# Patient Record
Sex: Male | Born: 1990 | Hispanic: No | Marital: Single
Health system: Midwestern US, Community
[De-identification: ages and names within clinical notes are randomized; demographics above are authoritative.]

## PROBLEM LIST (undated history)

## (undated) DIAGNOSIS — K8020 Calculus of gallbladder without cholecystitis without obstruction: Secondary | ICD-10-CM

---
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
## 2014-11-20 ENCOUNTER — Emergency Department (HOSPITAL_COMMUNITY)
Admission: EM | Admit: 2014-11-20 | Discharge: 2014-11-20 | Disposition: A | Discharge status: Home / Self Care | Payer: Self-pay | Attending: Emergency Medicine | Admitting: Emergency Medicine

## 2014-11-20 ENCOUNTER — Encounter (HOSPITAL_COMMUNITY): Payer: Self-pay | Admitting: *Deleted

## 2014-11-20 DIAGNOSIS — I159 Secondary hypertension, unspecified: Secondary | ICD-10-CM | POA: Insufficient documentation

## 2014-11-20 DIAGNOSIS — B9789 Other viral agents as the cause of diseases classified elsewhere: Secondary | ICD-10-CM

## 2014-11-20 DIAGNOSIS — J069 Acute upper respiratory infection, unspecified: Secondary | ICD-10-CM | POA: Insufficient documentation

## 2014-11-20 MED ORDER — IBUPROFEN 800 MG PO TABS
800.0000 mg | ORAL_TABLET | Freq: Once | ORAL | Status: AC
  Administered 2014-11-20: 800 mg via ORAL
  Filled 2014-11-20: qty 1

## 2014-11-20 MED ORDER — DM-GUAIFENESIN ER 30-600 MG PO TB12
1.0000 | ORAL_TABLET | ORAL | Status: AC
  Administered 2014-11-20: 1 via ORAL
  Filled 2014-11-20: qty 1

## 2014-11-20 MED ORDER — DICYCLOMINE HCL 10 MG PO CAPS
10.0000 mg | ORAL_CAPSULE | Freq: Once | ORAL | Status: AC
  Administered 2014-11-20: 10 mg via ORAL
  Filled 2014-11-20: qty 1

## 2014-11-20 NOTE — Discharge Instructions (Signed)
Upper Respiratory Infection, Adult Shane Wells, he was seen today for a viral respiratory infection. You can take Motrin and Mucinex at home for symptomatic relief. There is no cure, this will have to run its course. Follow-up with a primary care physician regarding this infection and your high blood pressure. It is dangerous for it to stay high as it was today, 193/75.  If symptoms worsen come back to emergency department immediately.  Thank you. An upper respiratory infection (URI) is also known as the common cold. It is often caused by a type of germ (virus). Colds are easily spread (contagious). You can pass it to others by kissing, coughing, sneezing, or drinking out of the same glass. Usually, you get better in 1 or 2 weeks.  HOME CARE   Only take medicine as told by your doctor.  Use a warm mist humidifier or breathe in steam from a hot shower.  Drink enough water and fluids to keep your pee (urine) clear or pale yellow.  Get plenty of rest.  Return to work when your temperature is back to normal or as told by your doctor. You may use a face mask and wash your hands to stop your cold from spreading. GET HELP RIGHT AWAY IF:   After the first few days, you feel you are getting worse.  You have questions about your medicine.  You have chills, shortness of breath, or brown or red spit (mucus).  You have yellow or brown snot (nasal discharge) or pain in the face, especially when you bend forward.  You have a fever, puffy (swollen) neck, pain when you swallow, or white spots in the back of your throat.  You have a bad headache, ear pain, sinus pain, or chest pain.  You have a high-pitched whistling sound when you breathe in and out (wheezing).  You have a lasting cough or cough up blood.  You have sore muscles or a stiff neck. MAKE SURE YOU:   Understand these instructions.  Will watch your condition.  Will get help right away if you are not doing well or get worse. Document  Released: 04/19/2008 Document Revised: 01/24/2012 Document Reviewed: 02/06/2014 [facility] Patient Information 2015 [place]. This information is not intended to replace advice given to you by your health care provider. Make sure you discuss any questions you have with your health care provider.  Hypertension Hypertension is another name for high blood pressure. High blood pressure forces your heart to work harder to pump blood. A blood pressure reading has two numbers, which includes a higher number over a lower number (example: 110/72). HOME CARE   Have your blood pressure rechecked by your doctor.  Only take medicine as told by your doctor. Follow the directions carefully. The medicine does not work as well if you skip doses. Skipping doses also puts you at risk for problems.  Do not smoke.  Monitor your blood pressure at home as told by your doctor. GET HELP IF:  You think you are having a reaction to the medicine you are taking.  You have repeat headaches or feel dizzy.  You have puffiness (swelling) in your ankles.  You have trouble with your vision. GET HELP RIGHT AWAY IF:   You get a very bad headache and are confused.  You feel weak, numb, or faint.  You get chest or belly (abdominal) pain.  You throw up (vomit).  You cannot breathe very well. MAKE SURE YOU:   Understand these instructions.  Will  watch your condition.  Will get help right away if you are not doing well or get worse. Document Released: 04/19/2008 Document Revised: 11/06/2013 Document Reviewed: 08/24/2013 [facility] Patient Information 2015 [place], [place]. This information is not intended to replace advice given to you by your health care provider. Make sure you discuss any questions you have with your health care provider.

## 2014-11-20 NOTE — ED Provider Notes (Signed)
CSN: [id number]     Arrival date & time 11/20/14  0435 History   First MD Initiated Contact with Patient 11/20/14 [phone]     Chief Complaint  Patient presents with  . Nasal Congestion  . Sore Throat     (Consider location/radiation/quality/duration/timing/severity/associated sxs/prior Treatment) HPI  Shane Wells is a 24 y.o. male with no significant past medical history coming in with 2-3 days of viral URI like symptoms. Patient states he has had nasal congestion and sore throat. He denies fevers. He has had no sick contacts. Patient states he has had coughing and diffuse body aches as well. He denies getting the flu shot this year, he thinks he has flu currently. He denies any abdominal pain, he has had nausea and posttussive emesis. Patient's bowel movements have been watery as well. He denies any changes to his urination. Patient has no further complaints.   10 Systems reviewed and are negative for acute change except as noted in the HPI.    History reviewed. No pertinent past medical history. History reviewed. No pertinent past surgical history. No family history on file. History  Substance Use Topics  . Smoking status: Never Smoker   . Smokeless tobacco: Never Used  . Alcohol Use: No    Review of Systems    Allergies  Review of patient's allergies indicates no known allergies.  Home Medications   Prior to Admission medications   Not on File   BP 193/75 mmHg  Pulse 85  Temp(Src) 98.6 F (37 C) (Oral)  Resp 18  Ht  (1.93 m)  Wt 320 lb (145.151 kg)  BMI 38.97 kg/m2  SpO2 99% Physical Exam  Constitutional: He is oriented to person, place, and time. Vital signs are normal. He appears well-developed and well-nourished.  Non-toxic appearance. He does not appear ill. No distress.  HENT:  Head: Normocephalic and atraumatic.  Nose: Nose normal.  Mouth/Throat: Oropharynx is clear and moist. No oropharyngeal exudate.  Mild erythema of the oropharynx. No exudates  seen.  Eyes: Conjunctivae and EOM are normal. Pupils are equal, round, and reactive to light. No scleral icterus.  Neck: Normal range of motion. Neck supple. No tracheal deviation, no edema, no erythema and normal range of motion present. No thyroid mass and no thyromegaly present.  Cardiovascular: Normal rate, regular rhythm, S1 normal, S2 normal, normal heart sounds, intact distal pulses and normal pulses.  Exam reveals no gallop and no friction rub.   No murmur heard. Pulses:      Radial pulses are 2+ on the right side, and 2+ on the left side.       Dorsalis pedis pulses are 2+ on the right side, and 2+ on the left side.  Pulmonary/Chest: Effort normal and breath sounds normal. No respiratory distress. He has no wheezes. He has no rhonchi. He has no rales.  Abdominal: Soft. Normal appearance and bowel sounds are normal. He exhibits no distension, no ascites and no mass. There is no hepatosplenomegaly. There is no tenderness. There is no rebound, no guarding and no CVA tenderness.  Musculoskeletal: Normal range of motion. He exhibits no edema or tenderness.  Lymphadenopathy:    He has no cervical adenopathy.  Neurological: He is alert and oriented to person, place, and time. He has normal strength. No cranial nerve deficit or sensory deficit. He exhibits normal muscle tone. GCS eye subscore is 4. GCS verbal subscore is 5. GCS motor subscore is 6.  Skin: Skin is warm, dry and intact. No  petechiae and no rash noted. He is not diaphoretic. No erythema. No pallor.  Psychiatric: He has a normal mood and affect. His behavior is normal. Judgment normal.  Nursing note and vitals reviewed.   ED Course  Procedures (including critical care time) Labs Review Labs Reviewed - No data to display  Imaging Review No results found.   EKG Interpretation None      MDM   Final diagnoses:  None    Patient presents emergency department for evaluation symptoms past 2-3 days. He describes rhinorrhea,  congestion, sore throat. Nothing has made his symptoms better or worse, he has taken no medications. In the emergency department he was given Mucinex, Motrin, Bentyl for relief. Patient will be discharged with an Motrin to take as needed. Primary care follow-up was advised within 3 days, follow-up was given. His vital signs remain within his normal limits and he is safe for discharge. Patient is asymptomatic regarding his high blood pressure.    Tomasita CrumbleAdeleke Bethene Hankinson, MD 11/20/14 [phone]

## 2014-11-20 NOTE — ED Notes (Signed)
Pt c/o of nasal congestion and sore throat that has worsen over the last few days.

## 2015-05-22 ENCOUNTER — Telehealth: Payer: Self-pay | Admitting: Hematology and Oncology

## 2015-05-22 NOTE — Telephone Encounter (Signed)
Gave and printed appt sched and avs fo rpt for OCT °

## 2016-11-15 ENCOUNTER — Emergency Department: Payer: BLUE CROSS/BLUE SHIELD

## 2016-11-15 ENCOUNTER — Emergency Department
Admission: EM | Admit: 2016-11-15 | Discharge: 2016-11-15 | Disposition: A | Discharge status: Home / Self Care | Payer: BLUE CROSS/BLUE SHIELD | Attending: Emergency Medicine | Admitting: Emergency Medicine

## 2016-11-15 ENCOUNTER — Encounter: Payer: Self-pay | Admitting: Emergency Medicine

## 2016-11-15 DIAGNOSIS — J069 Acute upper respiratory infection, unspecified: Secondary | ICD-10-CM | POA: Insufficient documentation

## 2016-11-15 DIAGNOSIS — B9789 Other viral agents as the cause of diseases classified elsewhere: Secondary | ICD-10-CM

## 2016-11-15 LAB — POCT RAPID STREP A: Streptococcus, Group A Screen (Direct): NEGATIVE

## 2016-11-15 MED ORDER — GUAIFENESIN-CODEINE 100-10 MG/5ML PO SOLN: 5.0000 mL | ORAL | 0 refills | Status: AC | PRN

## 2016-11-15 NOTE — ED Triage Notes (Signed)
Reports cough, congestion, ha.  No resp distress. Mask applied.

## 2016-11-15 NOTE — Discharge Instructions (Signed)
Follow-up with [facility] acute[facility] if any continued problems. Tylenol or ibuprofen as needed for throat pain Increase fluids. Robitussin AC as needed for cough. Be aware that this medication contains a narcotic and should not be taken while driving or operating machinery.

## 2016-11-15 NOTE — ED Provider Notes (Signed)
[facility] Emergency Department Provider Note   ____________________________________________   First MD Initiated Contact with Patient 11/15/16 1510     (approximate)  I have reviewed the triage vital signs and the nursing notes.   HISTORY  Chief Complaint Cough   HPI Shane Wells is a 26 y.o. male still complained of cough, congestion, headache. Patient states that he has been taking over-the-counter medication without any relief. He has not actually taken his temperature but has felt cold at times. He has been taking some throat lozenges for his sore throat. He states this medication has not helped with his cough. Patient states that he is.smoke but does admit to the use of marijuana. He denies any nausea, vomiting, diarrhea. Currently he rates his pain as an 8 out of 10.   History reviewed. No pertinent past medical history.  There are no active problems to display for this patient.   History reviewed. No pertinent surgical history.  Prior to Admission medications   Medication Sig Start Date End Date Taking? Authorizing Provider  guaiFENesin-codeine 100-10 MG/5ML syrup Take 5 mLs by mouth every 4 (four) hours as needed. 11/15/16   Tommi Rumps, PA-C    Allergies Patient has no known allergies.  No family history on file.  Social History Social History  Substance Use Topics  . Smoking status: Never Smoker  . Smokeless tobacco: Never Used  . Alcohol use No    Review of Systems Constitutional: No fever/chills ENT: Positive sore throat. Cardiovascular: Denies chest pain.  Respiratory: Denies shortness of breath. Positive cough. Gastrointestinal: No abdominal pain.  No nausea, no vomiting.  No diarrhea.   Musculoskeletal: Negative for back pain. Skin: Negative for rash. Neurological: Positive for headaches, no focal weakness or numbness.  10-point ROS otherwise  negative.  ____________________________________________   PHYSICAL EXAM:  VITAL SIGNS: ED Triage Vitals [11/15/16 1437]  Enc Vitals Group     BP 100/79     Pulse Rate 92     Resp 20     Temp 98.6 F (37 C)     Temp src      SpO2 98 %     Weight 300 lb (136.1 kg)     Height 6\' 4"  (1.93 m)     Head Circumference      Peak Flow      Pain Score 8     Pain Loc      Pain Edu?      Excl. in GC?     Constitutional: Alert and oriented. Well appearing and in no acute distress. Eyes: Conjunctivae are normal. PERRL. EOMI. Head: Atraumatic. Nose: Moderate congestion/rhinnorhea.  EACs and TMs are clear bilaterally. Mouth/Throat: Mucous membranes are moist.  Oropharynx non-erythematous. Posterior drainage is noted. Neck: No stridor.   Hematological/Lymphatic/Immunilogical: No cervical lymphadenopathy. Cardiovascular: Normal rate, regular rhythm. Grossly normal heart sounds.  Good peripheral circulation. Respiratory: Normal respiratory effort.  No retractions. Lungs CTAB. Gastrointestinal: Soft and nontender. No distention.  Musculoskeletal: Moves upper and lower extremities without any difficulty. Normal gait was noted. Neurologic:  Normal speech and language. No gross focal neurologic deficits are appreciated. No gait instability. Skin:  Skin is warm, dry and intact. No rash noted. Psychiatric: Mood and affect are normal. Speech and behavior are normal.  ____________________________________________   LABS (all labs ordered are listed, but only abnormal results are displayed)  Labs Reviewed  POCT RAPID STREP A    RADIOLOGY  Chest x-ray per radiologist is negative for acute  cardiopulmonary disease. I, Jaliah Foody L Shaka Cardin, personally viewed and evaluated these images (plain raTommi Rumpsdiographs) as part of my medical decision making, as well as reviewing the written report by the radiologist. ____________________________________________   PROCEDURES  Procedure(s) performed:  None  Procedures  Critical Care performed: No  ____________________________________________   INITIAL IMPRESSION / ASSESSMENT AND PLAN / ED COURSE  Pertinent labs & imaging results that were available during my care of the patient were reviewed by me and considered in my medical decision making (see chart for details).    Clinical Course    Patient is given a prescription for Robitussin-AC to be taken as needed for cough and congestion. He is to increase fluids. He is also encouraged to take Claritin or Zyrtec for nasal congestion. He also may use saline nose spray for congestion. Take Tylenol or ibuprofen as needed for headache or body aches. [facility] any continued problems.  ____________________________________________   FINAL CLINICAL IMPRESSION(S) / ED DIAGNOSES  Final diagnoses:  Viral URI with cough      NEW MEDICATIONS STARTED DURING THIS VISIT:  Discharge Medication List as of 11/15/2016  4:21 PM    START taking these medications   Details  guaiFENesin-codeine 100-10 MG/5ML syrup Take 5 mLs by mouth every 4 (four) hours as needed., Starting Mon 11/15/2016, Print         Note:  This document was prepared using Dragon voice recognition software and may include unintentional dictation errors.    Tommi Rumpshonda L Retha Bither, PA-C 11/15/16 1741    Sharman CheekPhillip Stafford, MD 11/17/16 [phone]

## 2017-11-25 ENCOUNTER — Emergency Department
Admission: EM | Admit: 2017-11-25 | Discharge: 2017-11-26 | Disposition: A | Discharge status: Home / Self Care | Payer: Self-pay | Attending: Emergency Medicine | Admitting: Emergency Medicine

## 2017-11-25 ENCOUNTER — Emergency Department: Payer: Self-pay

## 2017-11-25 ENCOUNTER — Other Ambulatory Visit: Payer: Self-pay

## 2017-11-25 ENCOUNTER — Encounter: Payer: Self-pay | Admitting: *Deleted

## 2017-11-25 DIAGNOSIS — R109 Unspecified abdominal pain: Secondary | ICD-10-CM | POA: Insufficient documentation

## 2017-11-25 DIAGNOSIS — K8020 Calculus of gallbladder without cholecystitis without obstruction: Secondary | ICD-10-CM | POA: Insufficient documentation

## 2017-11-25 LAB — URINALYSIS, COMPLETE (UACMP) WITH MICROSCOPIC
Bacteria, UA: NONE SEEN
Bilirubin Urine: NEGATIVE
GLUCOSE, UA: NEGATIVE mg/dL
Hgb urine dipstick: NEGATIVE
KETONES UR: 5 mg/dL — AB
LEUKOCYTES UA: NEGATIVE
Nitrite: NEGATIVE
Protein, ur: NEGATIVE mg/dL
Specific Gravity, Urine: 1.032 — ABNORMAL HIGH (ref 1.005–1.030)
Squamous Epithelial / LPF: NONE SEEN
pH: 5 (ref 5.0–8.0)

## 2017-11-25 LAB — CBC
HCT: 49 % (ref 40.0–52.0)
Hemoglobin: 16.7 g/dL (ref 13.0–18.0)
MCH: 30.4 pg (ref 26.0–34.0)
MCHC: 34 g/dL (ref 32.0–36.0)
MCV: 89.4 fL (ref 80.0–100.0)
Platelets: 209 10*3/uL (ref 150–440)
RBC: 5.49 MIL/uL (ref 4.40–5.90)
RDW: 13.4 % (ref 11.5–14.5)
WBC: 9 10*3/uL (ref 3.8–10.6)

## 2017-11-25 LAB — COMPREHENSIVE METABOLIC PANEL
ALT: 39 U/L (ref 17–63)
AST: 28 U/L (ref 15–41)
Albumin: 5.2 g/dL — ABNORMAL HIGH (ref 3.5–5.0)
Alkaline Phosphatase: 64 U/L (ref 38–126)
Anion gap: 12 (ref 5–15)
BUN: 13 mg/dL (ref 6–20)
CALCIUM: 9.5 mg/dL (ref 8.9–10.3)
CO2: 26 mmol/L (ref 22–32)
CREATININE: 0.91 mg/dL (ref 0.61–1.24)
Chloride: 99 mmol/L — ABNORMAL LOW (ref 101–111)
GFR calc Af Amer: >60 mL/min (ref >60)
Glucose, Bld: 91 mg/dL (ref 65–99)
Potassium: 3.6 mmol/L (ref 3.5–5.1)
Sodium: 137 mmol/L (ref 135–145)
Total Bilirubin: 1.2 mg/dL (ref 0.3–1.2)
Total Protein: 9 g/dL — ABNORMAL HIGH (ref 6.5–8.1)

## 2017-11-25 LAB — LIPASE, BLOOD: Lipase: 44 U/L (ref 11–51)

## 2017-11-25 NOTE — ED Provider Notes (Signed)
[facility] Provider Note    ____________________________________________   First MD Initiated Contact with Patient 11/25/17 2330     (approximate)  I have reviewed the triage vital signs and the nursing notes.   HISTORY  Chief Complaint Flank Pain and Abdominal Pain   HPI Shane Wells is a 27 y.o. male who comes into the hospital today with some right flank pain.  The patient has had this pain for about a week.  He states that the pain radiates around to the front of his abdomen.  He has not been taking anything for pain.  The patient states that it is a burning sensation.  The patient rates his pain a 3-4 out of 10 in intensity.  He denies any pain with urination or blood in his urine.  He is never had this pain before.  The patient denies nausea vomiting fevers lightheadedness or dizziness.  Since it had been going on and moving he decided to come into the hospital for further evaluation.  History reviewed. No pertinent past medical history.  There are no active problems to display for this patient.   History reviewed. No pertinent surgical history.  Prior to Admission medications   Medication Sig Start Date End Date Taking? Authorizing Provider  guaiFENesin-codeine 100-10 MG/5ML syrup Take 5 mLs by mouth every 4 (four) hours as needed. 11/15/16   Tommi RumpsSummers, Rhonda L, PA-C    Allergies Patient has no known allergies.  History reviewed. No pertinent family history.  Social History Social History   Tobacco Use  . Smoking status: Never Smoker  . Smokeless tobacco: Never Used  Substance Use Topics  . Alcohol use: No  . Drug use: Yes    Types: Marijuana    Review of Systems  Constitutional: No fever/chills Eyes: No visual changes. ENT: No sore throat. Cardiovascular: Denies chest pain. Respiratory: Denies shortness of breath. Gastrointestinal:  abdominal pain.  No nausea, no vomiting.  No diarrhea.  No  constipation. Genitourinary: Negative for dysuria. Musculoskeletal: back pain. Skin: Negative for rash. Neurological: Negative for headaches, focal weakness or numbness.   ____________________________________________   PHYSICAL EXAM:  VITAL SIGNS: ED Triage Vitals  Enc Vitals Group     BP 11/25/17 1933 (!) 147/84     Pulse Rate 11/25/17 1933 83     Resp 11/25/17 1933 16     Temp 11/25/17 1933 98.3 F (36.8 C)     Temp Source 11/25/17 1933 Oral     SpO2 11/25/17 1933 97 %     Weight 11/25/17 1934 (!) 320 lb (145.2 kg)     Height 11/25/17 1934 6\' 4"  (1.93 m)     Head Circumference --      Peak Flow --      Pain Score 11/25/17 1933 5     Pain Loc --      Pain Edu? --      Excl. in GC? --     Constitutional: Alert and oriented. Well appearing and in no acute distress. Eyes: Conjunctivae are normal. PERRL. EOMI. Head: Atraumatic. Nose: No congestion/rhinnorhea. Mouth/Throat: Mucous membranes are moist.  Oropharynx non-erythematous. Cardiovascular: Normal rate, regular rhythm. Grossly normal heart sounds.  Good peripheral circulation. Respiratory: Normal respiratory effort.  No retractions. Lungs CTAB. Gastrointestinal: Soft with some mild lateral tenderness to palpation. No distention.  Positive bowel sounds, right flank pain to palpation Musculoskeletal: No lower extremity tenderness nor edema.   Neurologic:  Normal speech and language. Skin:  Skin  is warm, dry and intact.  Psychiatric: Mood and affect are normal.   ____________________________________________   LABS (all labs ordered are listed, but only abnormal results are displayed)  Labs Reviewed  COMPREHENSIVE METABOLIC PANEL - Abnormal; Notable for the following components:      Result Value   Chloride 99 (*)    Total Protein 9.0 (*)    Albumin 5.2 (*)    All other components within normal limits  URINALYSIS, COMPLETE (UACMP) WITH MICROSCOPIC - Abnormal; Notable for the following components:   Color, Urine  AMBER (*)    APPearance HAZY (*)    Specific Gravity, Urine 1.032 (*)    Ketones, ur 5 (*)    All other components within normal limits  LIPASE, BLOOD  CBC   ____________________________________________  EKG  none ____________________________________________  RADIOLOGY  Ct Renal Stone Study  Result Date: 11/26/2017 CLINICAL DATA:  27 year old male with right flank pain. EXAM: CT ABDOMEN AND PELVIS WITHOUT CONTRAST TECHNIQUE: Multidetector CT imaging of the abdomen and pelvis was performed following the standard protocol without IV contrast. COMPARISON:  None. FINDINGS: Evaluation of this exam is limited in the absence of intravenous contrast. Lower chest: The visualized lung bases are clear. No intra-free air or free fluid. Hepatobiliary: The liver is unremarkable. No intrahepatic biliary ductal dilatation. There is a large stone in the gallbladder measuring up to 4 cm. No pericholecystic fluid. Ultrasound may provide better evaluation of the gallbladder. Pancreas: Unremarkable. No pancreatic ductal dilatation or surrounding inflammatory changes. Spleen: Normal in size without focal abnormality. Adrenals/Urinary Tract: Adrenal glands are unremarkable. Kidneys are normal, without renal calculi, focal lesion, or hydronephrosis. Bladder is unremarkable. Stomach/Bowel: Stomach is within normal limits. Appendix appears normal. No evidence of bowel wall thickening, distention, or inflammatory changes. Vascular/Lymphatic: No significant vascular findings are present. No enlarged abdominal or pelvic lymph nodes. Reproductive: The prostate and seminal vesicles are grossly unremarkable. Other: None Musculoskeletal: No acute or significant osseous findings. IMPRESSION: 1. Cholelithiasis without evidence of acute cholecystitis by CT. Ultrasound may provide better evaluation of the gallbladder. 2. No hydronephrosis or nephrolithiasis. 3. No bowel obstruction or active inflammation.  Normal appendix.  Electronically Signed   By: Elgie Collard M.D.   On: 11/26/2017 00:21   US Abdomen Limited Ruq  Result Date: 11/26/2017 CLINICAL DATA:  Gallstones on CT, abdominal pain EXAM: ULTRASOUND ABDOMEN LIMITED RIGHT UPPER QUADRANT COMPARISON:  CT 11/26/2017 FINDINGS: Gallbladder: Large non mobile shadowing stone measuring up to 4.7 cm. Normal wall thickness. Negative sonographic Murphy. Common bile duct: Diameter: 3.5 mm Liver: Increased echogenicity. Portal vein is patent on color Doppler imaging with normal direction of blood flow towards the liver. IMPRESSION: 1. Large non mobile shadowing stone in the gallbladder. Negative for focal tenderness or biliary dilatation 2. Increased hepatic echogenicity suggesting fatty infiltration Electronically Signed   By: Jasmine Pang M.D.   On: 11/26/2017 01:31    ____________________________________________   PROCEDURES  Procedure(s) performed: None  Procedures  Critical Care performed: No  ____________________________________________   INITIAL IMPRESSION / ASSESSMENT AND PLAN / ED COURSE  As part of my medical decision making, I reviewed the following data within the electronic MEDICAL RECORD NUMBER Notes from prior ED visits and [place] Controlled Substance Database   This is a 27 year old male who comes into the hospital today with some flank pain radiating around to his abdomen.  Differential diagnosis includes biliary disease, nephrolithiasis, musculoskeletal pain.  I sent the patient for a CT scan renal stone protocol.  I  also did check some blood work which was unremarkable.  The patient does have some dehydration.  The CT scan showed some cholelithiasis without evidence of cholecystitis and no hydronephrosis or nephrolithiasis.  I will give the patient a dose of Toradol and a liter of normal saline and I will send him for a right upper quadrant ultrasound.    She had a large nonmobile stone in the gallbladder but no focal tenderness and no biliary  dilatation.  He refused the Toradol.  He will be discharged home and encouraged to follow-up with surgery.  The patient has no further questions or concerns.  He will be discharged home.  ____________________________________________   FINAL CLINICAL IMPRESSION(S) / ED DIAGNOSES  Final diagnoses:  Abdominal pain  Right flank pain  Gallstones     ED Discharge Orders    None       Note:  This document was prepared using Dragon voice recognition software and may include unintentional dictation errors.    Rebecka Apley, MD 11/26/17 [phone]

## 2017-11-25 NOTE — ED Triage Notes (Signed)
Pt to ED reporting right flank pain and right lower abd pain x 3 days. PT reports pain decreased when walking and moving but increases when sitting or lying. No changes in urination reported. No fevers. No NV but diarrhea off and on for the past week.

## 2017-11-26 ENCOUNTER — Emergency Department: Payer: Self-pay

## 2017-11-26 MED ORDER — KETOROLAC TROMETHAMINE 30 MG/ML IJ SOLN
30.0000 mg | Freq: Once | INTRAMUSCULAR | Status: DC
  Filled 2017-11-26: qty 1

## 2017-11-26 MED ORDER — SODIUM CHLORIDE 0.9 % IV BOLUS (SEPSIS): 1000.0000 mL | Freq: Once | INTRAVENOUS | Status: DC

## 2017-11-26 NOTE — ED Notes (Signed)
Patient transported to Ultrasound 

## 2017-11-26 NOTE — Discharge Instructions (Signed)
Please treat your pain with tylenol and ibuprofen. Please avoid fatty or greasy foods. Please follow up with surgery

## 2017-11-26 NOTE — ED Notes (Signed)
Patient did not want to stuck and so refused meds.

## 2017-11-29 ENCOUNTER — Ambulatory Visit: Payer: Self-pay | Admitting: Surgery

## 2017-12-08 ENCOUNTER — Ambulatory Visit: Payer: Self-pay | Admitting: Surgery

## 2017-12-09 ENCOUNTER — Telehealth: Payer: Self-pay | Admitting: General Practice

## 2017-12-09 NOTE — Telephone Encounter (Signed)
Left a message for the patient to call the office patient no showed appointment. Please r/s if patient calls back.

## 2017-12-13 NOTE — Telephone Encounter (Signed)
Coming in tomorrow.  

## 2017-12-14 ENCOUNTER — Ambulatory Visit: Payer: Self-pay | Admitting: Surgery

## 2017-12-20 ENCOUNTER — Encounter: Payer: Self-pay | Admitting: General Practice

## 2017-12-20 ENCOUNTER — Telehealth: Payer: Self-pay | Admitting: General Practice

## 2017-12-20 NOTE — Telephone Encounter (Signed)
Patient has no showed or cancelled multiple times, or unable to contact, I have mailed a letter for the patient to contact our office. Please schedule if patient calls.

## 2018-06-29 ENCOUNTER — Other Ambulatory Visit: Payer: Self-pay

## 2018-06-29 ENCOUNTER — Emergency Department
Admission: EM | Admit: 2018-06-29 | Discharge: 2018-06-29 | Disposition: A | Discharge status: Home / Self Care | Payer: Self-pay | Attending: Emergency Medicine | Admitting: Emergency Medicine

## 2018-06-29 ENCOUNTER — Encounter: Payer: Self-pay | Admitting: Emergency Medicine

## 2018-06-29 DIAGNOSIS — Y9389 Activity, other specified: Secondary | ICD-10-CM | POA: Insufficient documentation

## 2018-06-29 DIAGNOSIS — Y92481 Parking lot as the place of occurrence of the external cause: Secondary | ICD-10-CM | POA: Insufficient documentation

## 2018-06-29 DIAGNOSIS — Y999 Unspecified external cause status: Secondary | ICD-10-CM | POA: Insufficient documentation

## 2018-06-29 DIAGNOSIS — M7918 Myalgia, other site: Secondary | ICD-10-CM | POA: Insufficient documentation

## 2018-06-29 HISTORY — DX: Calculus of gallbladder without cholecystitis without obstruction: K80.20

## 2018-06-29 MED ORDER — CYCLOBENZAPRINE HCL 5 MG PO TABS: 5.0000 mg | ORAL_TABLET | Freq: Three times a day (TID) | ORAL | 0 refills | Status: AC | PRN
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
## 2018-08-14 ENCOUNTER — Encounter: Payer: Self-pay | Admitting: Emergency Medicine

## 2018-08-14 ENCOUNTER — Emergency Department: Payer: Self-pay

## 2018-08-14 ENCOUNTER — Emergency Department
Admission: EM | Admit: 2018-08-14 | Discharge: 2018-08-14 | Disposition: A | Discharge status: Home / Self Care | Payer: Self-pay | Attending: Emergency Medicine | Admitting: Emergency Medicine

## 2018-08-14 ENCOUNTER — Other Ambulatory Visit: Payer: Self-pay

## 2018-08-14 DIAGNOSIS — M549 Dorsalgia, unspecified: Secondary | ICD-10-CM

## 2018-08-14 DIAGNOSIS — Z5321 Procedure and treatment not carried out due to patient leaving prior to being seen by health care provider: Secondary | ICD-10-CM | POA: Insufficient documentation

## 2018-08-14 DIAGNOSIS — M5489 Other dorsalgia: Secondary | ICD-10-CM | POA: Insufficient documentation

## 2018-08-14 LAB — COMPREHENSIVE METABOLIC PANEL
ALT: 36 U/L (ref 0–44)
ANION GAP: 10 (ref 5–15)
AST: 27 U/L (ref 15–41)
Albumin: 4.8 g/dL (ref 3.5–5.0)
Alkaline Phosphatase: 60 U/L (ref 38–126)
BILIRUBIN TOTAL: 0.9 mg/dL (ref 0.3–1.2)
BUN: 13 mg/dL (ref 6–20)
CHLORIDE: 101 mmol/L (ref 98–111)
CO2: 28 mmol/L (ref 22–32)
Calcium: 9.5 mg/dL (ref 8.9–10.3)
Creatinine, Ser: 0.89 mg/dL (ref 0.61–1.24)
GFR calc Af Amer: >60 mL/min (ref >60)
GFR calc non Af Amer: >60 mL/min (ref >60)
Glucose, Bld: 92 mg/dL (ref 70–99)
POTASSIUM: 4.4 mmol/L (ref 3.5–5.1)
Sodium: 139 mmol/L (ref 135–145)
TOTAL PROTEIN: 8.6 g/dL — AB (ref 6.5–8.1)

## 2018-08-14 LAB — CBC WITH DIFFERENTIAL/PLATELET
BASOS ABS: 0.1 10*3/uL (ref 0–0.1)
Basophils Relative: 1 %
EOS PCT: 2 %
Eosinophils Absolute: 0.2 10*3/uL (ref 0–0.7)
HEMATOCRIT: 48.6 % (ref 40.0–52.0)
Hemoglobin: 16.8 g/dL (ref 13.0–18.0)
LYMPHS ABS: 2.8 10*3/uL (ref 1.0–3.6)
LYMPHS PCT: 38 %
MCH: 31.4 pg (ref 26.0–34.0)
MCHC: 34.6 g/dL (ref 32.0–36.0)
MCV: 90.9 fL (ref 80.0–100.0)
Monocytes Absolute: 0.5 10*3/uL (ref 0.2–1.0)
Monocytes Relative: 7 %
NEUTROS ABS: 3.8 10*3/uL (ref 1.4–6.5)
Neutrophils Relative %: 52 %
Platelets: 227 10*3/uL (ref 150–440)
RBC: 5.35 MIL/uL (ref 4.40–5.90)
RDW: 13.6 % (ref 11.5–14.5)
WBC: 7.3 10*3/uL (ref 3.8–10.6)

## 2018-08-14 LAB — URINALYSIS, COMPLETE (UACMP) WITH MICROSCOPIC
Bacteria, UA: NONE SEEN
Bilirubin Urine: NEGATIVE
Glucose, UA: NEGATIVE mg/dL
Hgb urine dipstick: NEGATIVE
KETONES UR: NEGATIVE mg/dL
Leukocytes, UA: NEGATIVE
Nitrite: NEGATIVE
PH: 5 (ref 5.0–8.0)
PROTEIN: NEGATIVE mg/dL
Specific Gravity, Urine: 1.028 (ref 1.005–1.030)

## 2018-08-14 LAB — LIPASE, BLOOD: Lipase: 37 U/L (ref 11–51)

## 2018-08-14 NOTE — ED Notes (Signed)
Pt refusing blood draw, Dr. Darnelle Catalan notified.

## 2018-08-14 NOTE — ED Triage Notes (Signed)
Pt arrives POV and ambulatory to triage with c/o upper back pain. Pt reports hx of gallstones which he was diagnosed with in January and supposed to follow up. Pt reports not following up. Pt is in NAD.

## 2018-08-14 NOTE — ED Provider Notes (Addendum)
[facility] Emergency Department Provider Note   ____________________________________________   First MD Initiated Contact with Patient 08/14/18 [phone]     (approximate)  I have reviewed the triage vital signs and the nursing notes.   HISTORY  Chief Complaint Back Pain    HPI Shane Wells is a 27 y.o. male patient diagnosed with history of gallstones was supposed to have them out but never did.  Comes in now complaining of some pain in his back it had been on the right side is been removed to the left now it is worse when he tries to stand up straight better if he hunches over.  Pain is toward the bottom of the ribs on the left currently, deep and achy.  He is not running a fever does not have a cough is not short of breath has no belly pain nausea or vomiting.  Pain is mild to moderate not severe.   Past Medical History:  Diagnosis Date  . Gallstone     There are no active problems to display for this patient.   History reviewed. No pertinent surgical history.  Prior to Admission medications   Medication Sig Start Date End Date Taking? Authorizing Provider  cyclobenzaprine (FLEXERIL) 5 MG tablet Take 1 tablet (5 mg total) by mouth 3 (three) times daily as needed for muscle spasms. 06/29/18   Menshew, Charlesetta Ivory, PA-C  guaiFENesin-codeine 100-10 MG/5ML syrup Take 5 mLs by mouth every 4 (four) hours as needed. 11/15/16   Tommi Rumps, PA-C    Allergies Patient has no known allergies.  No family history on file.  Social History Social History   Tobacco Use  . Smoking status: Never Smoker  . Smokeless tobacco: Never Used  Substance Use Topics  . Alcohol use: No  . Drug use: Yes    Types: Marijuana    Review of Systems  Constitutional: No fever/chills Eyes: No visual changes. ENT: No sore throat. Cardiovascular: Denies chest pain. Respiratory: Denies shortness of breath. Gastrointestinal: No abdominal pain.  No nausea, no  vomiting.  No diarrhea.  No constipation. Genitourinary: Negative for dysuria. Musculoskeletal: See HPI. Skin: Negative for rash. Neurological: Negative for headaches, focal weakness   ____________________________________________   PHYSICAL EXAM:  VITAL SIGNS: ED Triage Vitals  Enc Vitals Group     BP 08/14/18 0546 (!) 150/104     Pulse Rate 08/14/18 0546 78     Resp 08/14/18 0546 18     Temp 08/14/18 0546 97.7 F (36.5 C)     Temp Source 08/14/18 0546 Oral     SpO2 08/14/18 0546 100 %     Weight 08/14/18 0542 (!) 325 lb (147.4 kg)     Height 08/14/18 0542 6\' 4"  (1.93 m)     Head Circumference --      Peak Flow --      Pain Score 08/14/18 0541 6     Pain Loc --      Pain Edu? --      Excl. in GC? --     Constitutional: Alert and oriented. Well appearing and in no acute distress. Eyes: Conjunctivae are normal.  Head: Atraumatic. Nose: No congestion/rhinnorhea. Mouth/Throat: Mucous membranes are moist.  Oropharynx non-erythematous. Neck: No stridor.  Cardiovascular: Normal rate, regular rhythm. Grossly normal heart sounds.  Good peripheral circulation. Respiratory: Normal respiratory effort.  No retractions. Lungs CTAB. Gastrointestinal: Soft and nontender. No distention. No abdominal bruits. No CVA tenderness. Musculoskeletal: No lower extremity tenderness nor  edema.  Back is not tender to palpation or percussion Neurologic:  Normal speech and language. No gross focal neurologic deficits are appreciated. Skin:  Skin is warm, dry and intact. No rash noted. Psychiatric: Mood and affect are normal. Speech and behavior are normal.  ____________________________________________   LABS (all labs ordered are listed, but only abnormal results are displayed)  Labs Reviewed  URINALYSIS, COMPLETE (UACMP) WITH MICROSCOPIC - Abnormal; Notable for the following components:      Result Value   Color, Urine YELLOW (*)    APPearance CLEAR (*)    All other components within normal  limits  COMPREHENSIVE METABOLIC PANEL - Abnormal; Notable for the following components:   Total Protein 8.6 (*)    All other components within normal limits  CBC WITH DIFFERENTIAL/PLATELET  LIPASE, BLOOD   ____________________________________________  EKG   ____________________________________________  RADIOLOGY  ED MD interpretation: X-ray read by radiology reviewed by me is shows no acute disease  Official radiology report(s): No results found.  ____________________________________________   PROCEDURES  Procedure(s) performed:  Procedures  Critical Care performed:  ____________________________________________   INITIAL IMPRESSION / ASSESSMENT AND PLAN / ED COURSE  Patient with upper back pain over the muscles.  Nothing on labs or x-rays.  This is probably musculoskeletal pain.  I have patient follow-up with his doctor.  Has a history of gallstones for which I will have him follow-up with [facility] surgical.         ____________________________________________   FINAL CLINICAL IMPRESSION(S) / ED DIAGNOSES  Final diagnoses:  Upper back pain     ED Discharge Orders    None       Note:  This document was prepared using Dragon voice recognition software and may include unintentional dictation errors.    Arnaldo Natal, MD 08/18/18 1437    Arnaldo Natal, MD 08/18/18 [phone]

## 2018-08-14 NOTE — Discharge Instructions (Signed)
Try Motrin 3 of the over-the-counter pills 3 times a day with food for this.  Please return if you are worse or no better in 2 or 3 days.  Also return for fever vomiting shortness of breath or any other problems.  Follow-up with [facility] surgical for the gallbladder.

## 2018-08-14 NOTE — ED Notes (Signed)
Pt walked to XR.

## 2018-08-14 NOTE — ED Notes (Signed)
Pt refused labs. Pt stated " the last time the lady had to use the ultrasound thing." this writer let first nurse know what was stated.

## 2019-09-12 IMAGING — CT CT RENAL STONE PROTOCOL
2 of 4 series · 16 of 46 positions shown, 18 images · non-contrast
Comparison: None.

CLINICAL DATA: 26-year-old male with right flank pain.

EXAM:
CT ABDOMEN AND PELVIS WITHOUT CONTRAST
TECHNIQUE: Multidetector CT imaging of the abdomen and pelvis was performed
following the standard protocol without IV contrast.

[Series 2: stone full standard · axial · 0.98mm/px · z∈[-599,-94]mm · 13 of 111 slices shown, 15 images]
[im 5/111  soft-tissue]
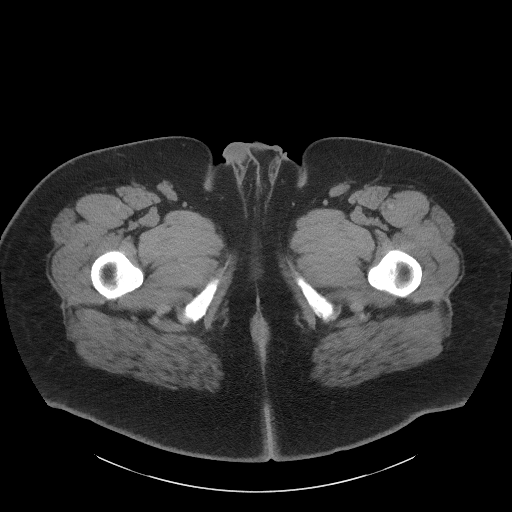
[im 5/111  bone]
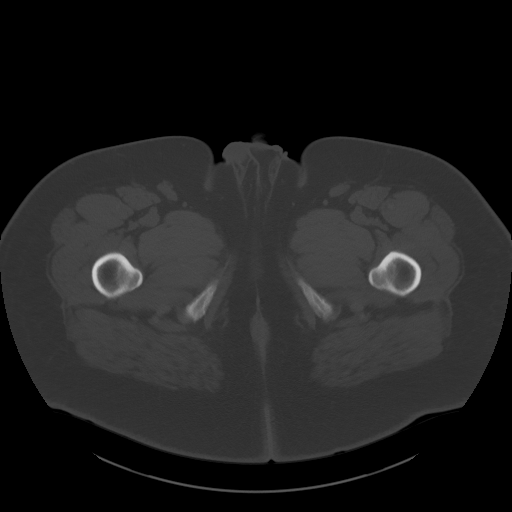
[im 15/111  soft-tissue]
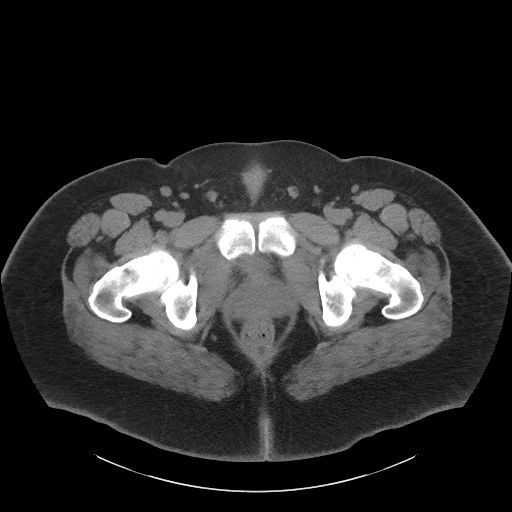
[im 24/111  soft-tissue]
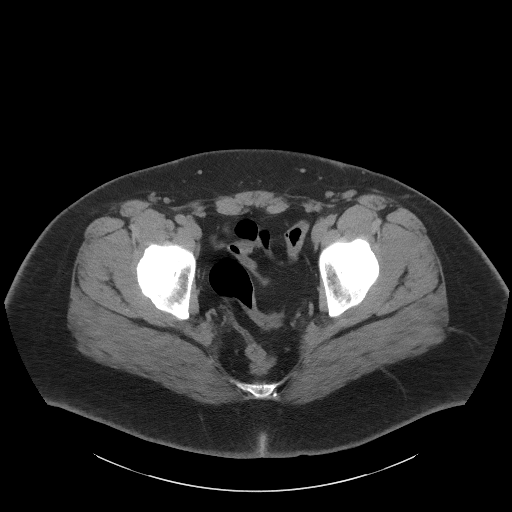
[im 29/111  soft-tissue]
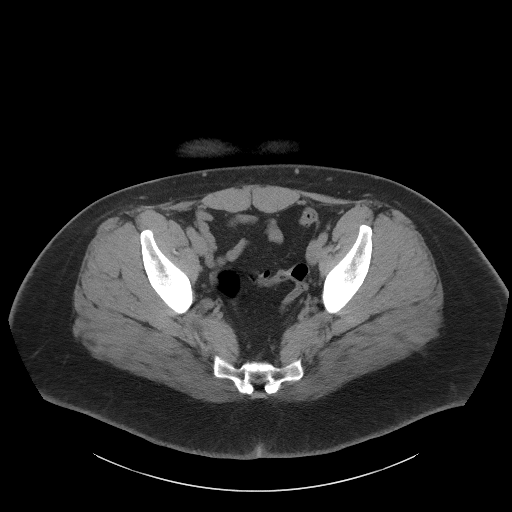
[im 39/111  soft-tissue]
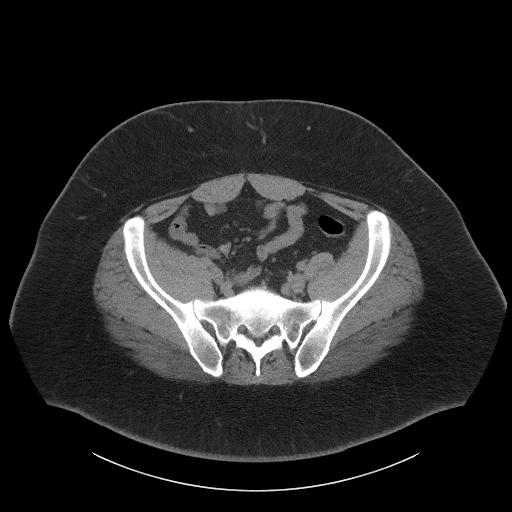
[im 48/111  soft-tissue]
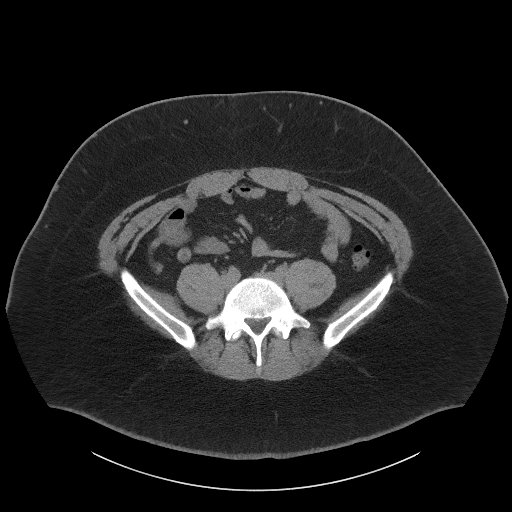
[im 58/111  soft-tissue]
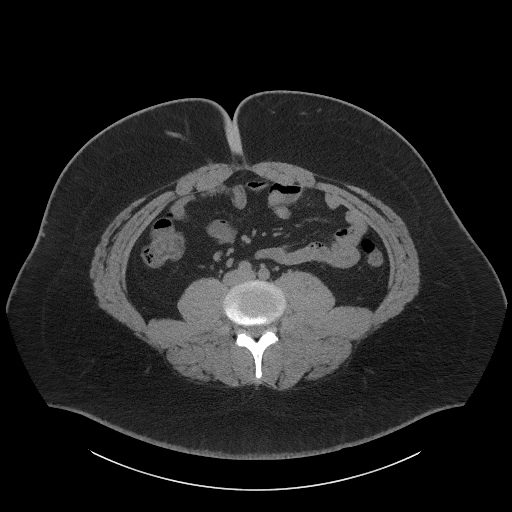
[im 63/111  soft-tissue]
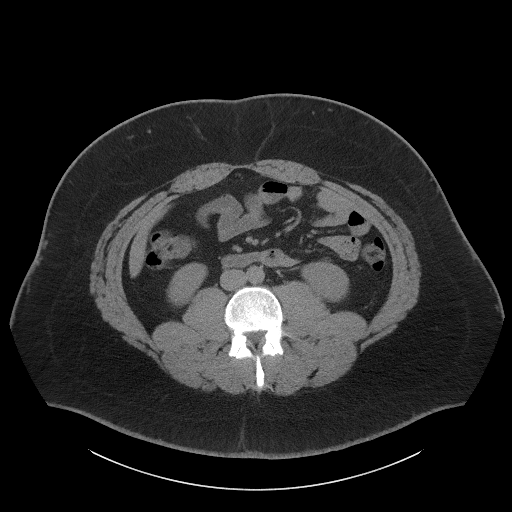
[im 72/111  soft-tissue]
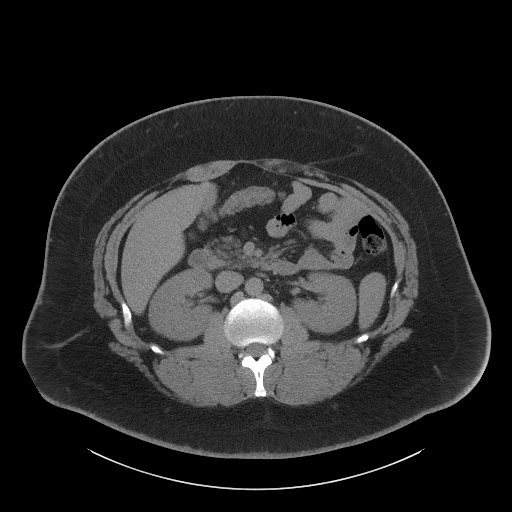
[im 72/111  bone]
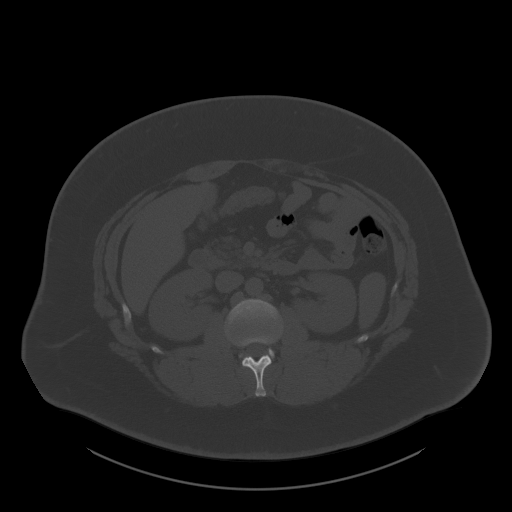
[im 82/111  soft-tissue]
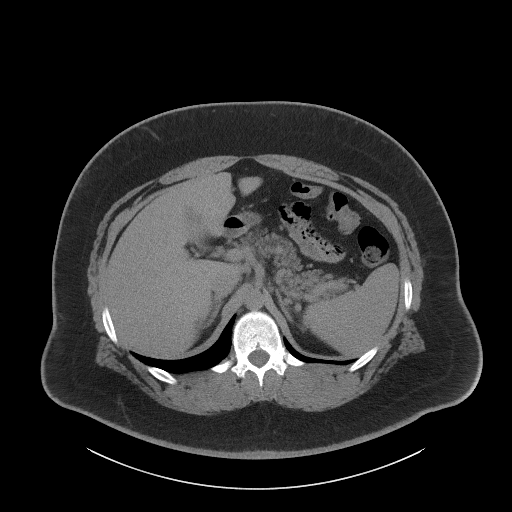
[im 87/111  soft-tissue]
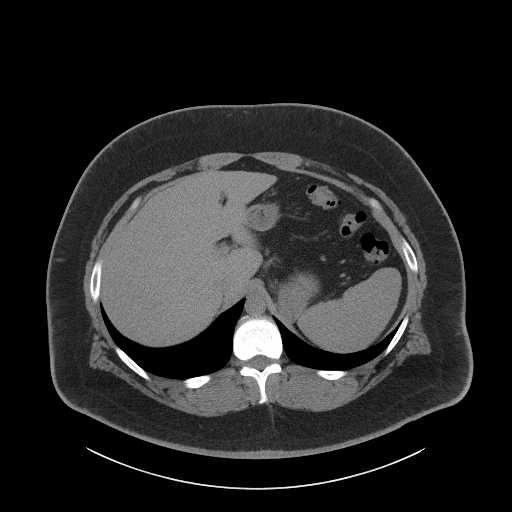
[im 96/111  soft-tissue]
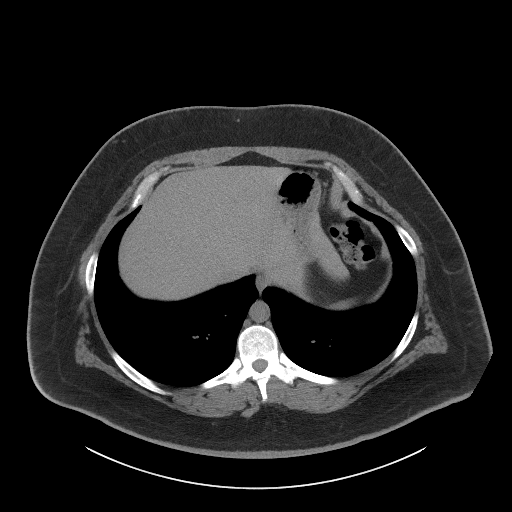
[im 106/111  soft-tissue]
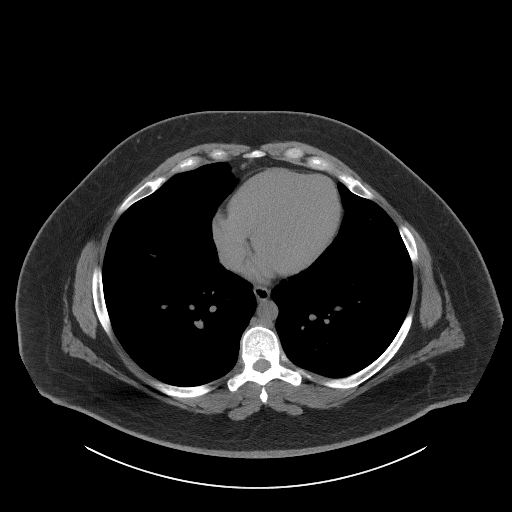

[Series 5: coronal · coronal · 0.97mm/px · 3 of 175 slices shown]
[im 59/175  soft-tissue]
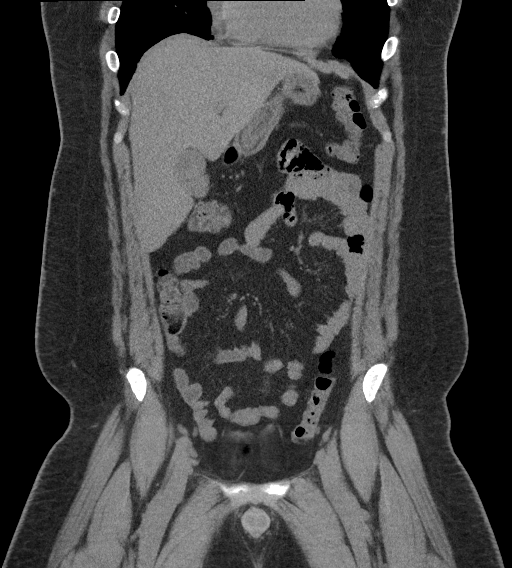
[im 78/175  soft-tissue]
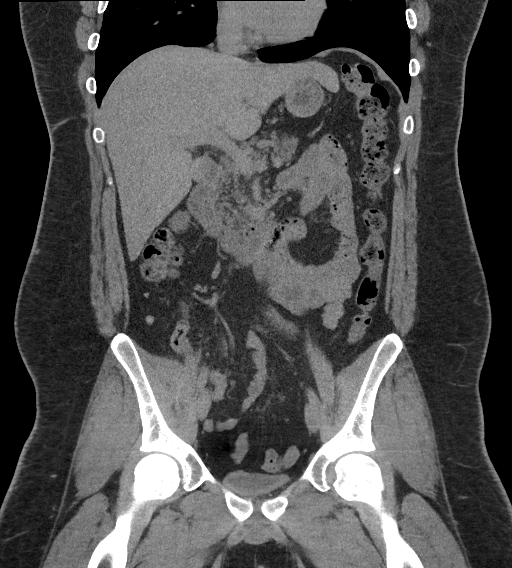
[im 97/175  soft-tissue]
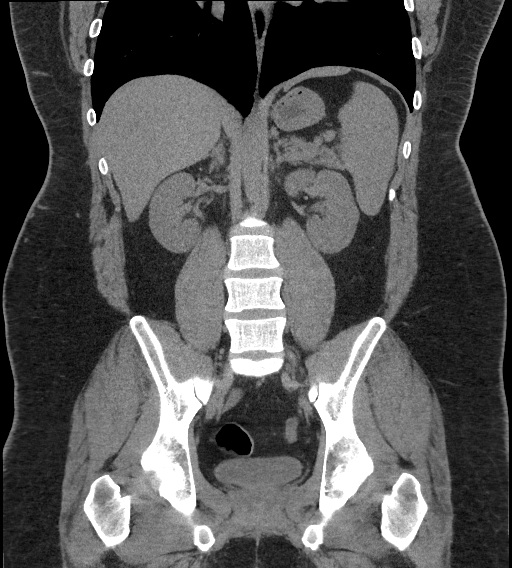

[16 of 46 positions shown; findings below may reference images not displayed]

FINDINGS: Evaluation of this exam is limited in the absence of intravenous
contrast.

Lower chest: The visualized lung bases are clear.

No intra-free air or free fluid.

Hepatobiliary: The liver is unremarkable. No intrahepatic biliary
ductal dilatation. There is a large stone in the gallbladder
measuring up to 4 cm. No pericholecystic fluid. Ultrasound may
provide better evaluation of the gallbladder.

Pancreas: Unremarkable. No pancreatic ductal dilatation or
surrounding inflammatory changes.

Spleen: Normal in size without focal abnormality.

Adrenals/Urinary Tract: Adrenal glands are unremarkable. Kidneys are
normal, without renal calculi, focal lesion, or hydronephrosis.
Bladder is unremarkable.

Stomach/Bowel: Stomach is within normal limits. Appendix appears
normal. No evidence of bowel wall thickening, distention, or
inflammatory changes.

Vascular/Lymphatic: No significant vascular findings are present. No
enlarged abdominal or pelvic lymph nodes.

Reproductive: The prostate and seminal vesicles are grossly
unremarkable.

Other: None

Musculoskeletal: No acute or significant osseous findings.
IMPRESSION: 1. Cholelithiasis without evidence of acute cholecystitis by CT.
Ultrasound may provide better evaluation of the gallbladder.
2. No hydronephrosis or nephrolithiasis.
3. No bowel obstruction or active inflammation.  Normal appendix.

## 2020-05-29 ENCOUNTER — Emergency Department
Admission: EM | Admit: 2020-05-29 | Discharge: 2020-05-29 | Disposition: A | Discharge status: Home / Self Care | Payer: Self-pay | Attending: Emergency Medicine | Admitting: Emergency Medicine

## 2020-05-29 ENCOUNTER — Other Ambulatory Visit: Payer: Self-pay

## 2020-05-29 DIAGNOSIS — M79602 Pain in left arm: Secondary | ICD-10-CM | POA: Insufficient documentation

## 2020-05-29 DIAGNOSIS — R55 Syncope and collapse: Secondary | ICD-10-CM | POA: Insufficient documentation

## 2020-05-29 LAB — URINALYSIS, COMPLETE (UACMP) WITH MICROSCOPIC
Bilirubin Urine: NEGATIVE
Glucose, UA: NEGATIVE mg/dL
Hgb urine dipstick: NEGATIVE
Ketones, ur: NEGATIVE mg/dL
Leukocytes,Ua: NEGATIVE
Nitrite: NEGATIVE
Protein, ur: NEGATIVE mg/dL
Specific Gravity, Urine: 1.017 (ref 1.005–1.030)
pH: 7 (ref 5.0–8.0)

## 2020-05-29 LAB — BASIC METABOLIC PANEL
Anion gap: 10 (ref 5–15)
BUN: 14 mg/dL (ref 6–20)
CO2: 25 mmol/L (ref 22–32)
Calcium: 8.8 mg/dL — ABNORMAL LOW (ref 8.9–10.3)
Chloride: 102 mmol/L (ref 98–111)
Creatinine, Ser: 0.88 mg/dL (ref 0.61–1.24)
GFR calc Af Amer: >60 mL/min (ref >60)
GFR calc non Af Amer: >60 mL/min (ref >60)
Glucose, Bld: 101 mg/dL — ABNORMAL HIGH (ref 70–99)
Potassium: 3.7 mmol/L (ref 3.5–5.1)
Sodium: 137 mmol/L (ref 135–145)

## 2020-05-29 LAB — CBC
HCT: 44.8 % (ref 39.0–52.0)
Hemoglobin: 15.3 g/dL (ref 13.0–17.0)
MCH: 29.9 pg (ref 26.0–34.0)
MCHC: 34.2 g/dL (ref 30.0–36.0)
MCV: 87.5 fL (ref 80.0–100.0)
Platelets: 237 10*3/uL (ref 150–400)
RBC: 5.12 MIL/uL (ref 4.22–5.81)
RDW: 13.1 % (ref 11.5–15.5)
WBC: 6.4 10*3/uL (ref 4.0–10.5)
nRBC: 0 % (ref 0.0–0.2)

## 2020-05-29 LAB — TROPONIN I (HIGH SENSITIVITY): Troponin I (High Sensitivity): 2 ng/L (ref <18)

## 2020-05-29 MED ORDER — SODIUM CHLORIDE 0.9% FLUSH: 3.0000 mL | Freq: Once | INTRAVENOUS | Status: DC

## 2020-05-29 MED ORDER — SODIUM CHLORIDE 0.9 % IV SOLN: Freq: Once | INTRAVENOUS | Status: DC

## 2020-05-29 NOTE — ED Triage Notes (Signed)
Pt states he had a near syncopal episode yesterday while working out in the sun and since having intermittent left upper arm pain. Pt is in NAD.

## 2020-05-29 NOTE — ED Provider Notes (Signed)
  ER Provider Note       Time seen: 10:48 AM    I have reviewed the vital signs and the nursing notes.  HISTORY   Chief Complaint Near Syncope    HPI Shane Wells is a 29 y.o. male with no known past medical history who presents today for near syncope while working out in the sun.  He is also had some left upper arm pain.  Discomfort is 7 out of 10, nothing makes it better or worse.  He denies fevers, chills or other complaints.  Past Medical History:  Diagnosis Date  . Gallstone     History reviewed. No pertinent surgical history.  Allergies Patient has no known allergies.  Review of Systems Constitutional: Negative for fever. Cardiovascular: Negative for chest pain. Respiratory: Negative for shortness of breath. Gastrointestinal: Negative for abdominal pain, vomiting and diarrhea. Musculoskeletal: Positive for arm pain Skin: Negative for rash. Neurological: Negative for headaches, positive for weakness  All systems negative/normal/unremarkable except as stated in the HPI  ____________________________________________   PHYSICAL EXAM:  VITAL SIGNS: Vitals:   05/29/20 1014 05/29/20 1020  BP: (!) 154/98   Pulse:  (!) 57  Resp:    Temp:    SpO2:  99%    Constitutional: Alert and oriented. Well appearing and in no distress. Eyes: Conjunctivae are normal. Normal extraocular movements. ENT      Head: Normocephalic and atraumatic.      Nose: No congestion/rhinnorhea.      Mouth/Throat: Mucous membranes are moist.      Neck: No stridor. Cardiovascular: Normal rate, regular rhythm. No murmurs, rubs, or gallops. Respiratory: Normal respiratory effort without tachypnea nor retractions. Breath sounds are clear and equal bilaterally. No wheezes/rales/rhonchi. Gastrointestinal: Soft and nontender. Normal bowel sounds Musculoskeletal: Nontender with normal range of motion in extremities. No lower extremity tenderness nor edema. Neurologic:  Normal speech and  language. No gross focal neurologic deficits are appreciated.  Skin:  Skin is warm, dry and intact. No rash noted. Psychiatric: Speech and behavior are normal.  ____________________________________________  EKG: Interpreted by me.  Sinus rhythm with rate of 66 bpm, normal PR interval, normal QRS, normal QT  ____________________________________________   LABS (pertinent positives/negatives)  Labs Reviewed  BASIC METABOLIC PANEL - Abnormal; Notable for the following components:      Result Value   Glucose, Bld 101 (*)    Calcium 8.8 (*)    All other components within normal limits  URINALYSIS, COMPLETE (UACMP) WITH MICROSCOPIC - Abnormal; Notable for the following components:   Color, Urine YELLOW (*)    APPearance CLEAR (*)    Bacteria, UA RARE (*)    All other components within normal limits  CBC  CBG MONITORING, ED  TROPONIN I (HIGH SENSITIVITY)    DIFFERENTIAL DIAGNOSIS  Heat exhaustion, heat related illness, dehydration, electrolyte abnormality, MI  ASSESSMENT AND PLAN  Near syncope   Plan: The patient had presented for a near syncopal episode that happened yesterday. Patient's labs have been unremarkable.  Unclear etiology for his symptoms.  He is cleared for outpatient follow-up.  Daryel November MD    Note: This note was generated in part or whole with voice recognition software. Voice recognition is usually quite accurate but there are transcription errors that can and very often do occur. I apologize for any typographical errors that were not detected and corrected.     Emily Filbert, MD 05/29/20 1244

## 2020-07-01 ENCOUNTER — Emergency Department
Admission: EM | Admit: 2020-07-01 | Discharge: 2020-07-01 | Disposition: A | Discharge status: Home / Self Care | Payer: Self-pay | Attending: Emergency Medicine | Admitting: Emergency Medicine

## 2020-07-01 ENCOUNTER — Other Ambulatory Visit: Payer: Self-pay

## 2020-07-01 DIAGNOSIS — Z20822 Contact with and (suspected) exposure to covid-19: Secondary | ICD-10-CM

## 2020-07-01 DIAGNOSIS — R197 Diarrhea, unspecified: Secondary | ICD-10-CM

## 2020-07-01 DIAGNOSIS — R110 Nausea: Secondary | ICD-10-CM | POA: Insufficient documentation

## 2020-07-01 DIAGNOSIS — R531 Weakness: Secondary | ICD-10-CM | POA: Insufficient documentation

## 2020-07-01 NOTE — ED Triage Notes (Signed)
Per gabby NT pt refusing labs at this time.

## 2020-07-01 NOTE — ED Provider Notes (Signed)
[facility] Emergency Department Provider Note  ____________________________________________   First MD Initiated Contact with Patient 07/01/20 1512     (approximate)  I have reviewed the triage vital signs and the nursing notes.   HISTORY  Chief Complaint Weakness    HPI Philo Kurtz is a 29 y.o. male presents emergency department complaining of feeling kind of weak with nausea/dry heaves/diarrhea.   Patient states symptoms started yesterday.  He denies any fever or chills.  Patient is unvaccinated for Covid.  He denies any chest pain or shortness of breath.  At this time he states he is a very hard stick and would rather not have blood type labs drawn.  He did agree to get a urinalysis and Covid test.   Past Medical History:  Diagnosis Date  . Gallstone     There are no problems to display for this patient.   History reviewed. No pertinent surgical history.  Prior to Admission medications   Medication Sig Start Date End Date Taking? Authorizing Provider  cyclobenzaprine (FLEXERIL) 5 MG tablet Take 1 tablet (5 mg total) by mouth 3 (three) times daily as needed for muscle spasms. 06/29/18   Menshew, Charlesetta Ivory, PA-C  guaiFENesin-codeine 100-10 MG/5ML syrup Take 5 mLs by mouth every 4 (four) hours as needed. 11/15/16   Tommi Rumps, PA-C    Allergies Patient has no known allergies.  History reviewed. No pertinent family history.  Social History Social History   Tobacco Use  . Smoking status: Never Smoker  . Smokeless tobacco: Never Used  Substance Use Topics  . Alcohol use: No  . Drug use: Yes    Types: Marijuana    Review of Systems  Constitutional: No fever/chills Eyes: No visual changes. ENT: No sore throat. Respiratory: Denies cough Cardiovascular: Denies chest pain Gastrointestinal: Denies abdominal pain, positive nausea and diarrhea Genitourinary: Negative for dysuria. Musculoskeletal: Negative for back  pain. Skin: Negative for rash. Psychiatric: no mood changes,     ____________________________________________   PHYSICAL EXAM:  VITAL SIGNS: ED Triage Vitals  Enc Vitals Group     BP 07/01/20 1306 (!) 154/114     Pulse Rate 07/01/20 1306 76     Resp 07/01/20 1306 16     Temp 07/01/20 1306 98.8 F (37.1 C)     Temp Source 07/01/20 1306 Oral     SpO2 07/01/20 1306 100 %     Weight 07/01/20 1307 (!) 398 lb (180.5 kg)     Height 07/01/20 1307 6\' 4"  (1.93 m)     Head Circumference --      Peak Flow --      Pain Score 07/01/20 1306 3     Pain Loc --      Pain Edu? --      Excl. in GC? --     Constitutional: Alert and oriented. Well appearing and in no acute distress. Eyes: Conjunctivae are normal.  Head: Atraumatic. Nose: No congestion/rhinnorhea. Mouth/Throat: Mucous membranes are moist.   Neck:  supple no lymphadenopathy noted Cardiovascular: Normal rate, regular rhythm. Heart sounds are normal Respiratory: Normal respiratory effort.  No retractions, lungs c t a  Abd: soft nontender bs normal all 4 quad, negative Murphy sign, McBurney's point is nontender GU: deferred Musculoskeletal: FROM all extremities, warm and well perfused Neurologic:  Normal speech and language.  Skin:  Skin is warm, dry and intact. No rash noted. Psychiatric: Mood and affect are normal. Speech and behavior are normal.  ____________________________________________  LABS (all labs ordered are listed, but only abnormal results are displayed)  Labs Reviewed  CBG MONITORING, ED   ____________________________________________   ____________________________________________  RADIOLOGY    ____________________________________________   PROCEDURES  Procedure(s) performed: No  Procedures    ____________________________________________   INITIAL IMPRESSION / ASSESSMENT AND PLAN / ED COURSE  Pertinent labs & imaging results that were available during my care of the patient were  reviewed by me and considered in my medical decision making (see chart for details).   Patient is a 29 year old male presents emergency department with nausea and diarrhea.  See HPI.  Physical exam is basically unremarkable.  Patient does appear well.  He is not tender in the abdomen.  Due to the symptoms I do feel need Covid rule out.  DDx: Gastroenteritis, Covid, viral syndrome, UTI, dehydration  UA and Covid test ordered  Patient has become frustrated with his waiting time.  He wants to leave and just take a work note.  States he will go by [employer] [facility] or [facility] for Covid testing.  I did give him discharge papers and a work note.  He is refusing all testing here.  Discharged in stable condition.  However he was not willing to wait for nurse to sign him out.     Merlon Alcorta was evaluated in Emergency Department on 07/01/2020 for the symptoms described in the history of present illness. He was evaluated in the context of the global COVID-19 pandemic, which necessitated consideration that the patient might be at risk for infection with the SARS-CoV-2 virus that causes COVID-19. Institutional protocols and algorithms that pertain to the evaluation of patients at risk for COVID-19 are in a state of rapid change based on information released by regulatory bodies including the CDC and federal and state organizations. These policies and algorithms were followed during the patient's care in the ED.    As part of my medical decision making, I reviewed the following data within the electronic MEDICAL RECORD NUMBER Nursing notes reviewed and incorporated, Old chart reviewed, Notes from prior ED visits and [place] Controlled Substance Database  ____________________________________________   FINAL CLINICAL IMPRESSION(S) / ED DIAGNOSES  Final diagnoses:  Diarrhea, unspecified type  Suspected COVID-19 virus infection      NEW MEDICATIONS STARTED DURING THIS VISIT:  New Prescriptions   No medications on  file     Note:  This document was prepared using Dragon voice recognition software and may include unintentional dictation errors.    Faythe Ghee, PA-C 07/01/20 1704    Chesley Noon, MD 07/02/20 1726

## 2020-07-01 NOTE — ED Notes (Signed)
Per susan pa, pt became frustrated and left. Pt was up for discharge but paperwork had not been printed.

## 2020-07-01 NOTE — Discharge Instructions (Signed)
Follow[facility]up with [facility] for [facility] test.  Return emergency department worsening.  If Covid test is positive you will need to quarantine for 10 to 14 days.  If negative he can return to work when your diarrhea subsides.  Take over-the-counter Imodium AD for diarrhea.

## 2020-07-01 NOTE — ED Triage Notes (Signed)
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 States diarrhea as well since yesterday. A&O, ambulatory. Speech clear. Dry heaves but no vomit. No fevers.

## 2020-08-01 ENCOUNTER — Ambulatory Visit: Payer: Self-pay

## 2021-03-25 ENCOUNTER — Emergency Department: Payer: Self-pay

## 2021-03-25 ENCOUNTER — Emergency Department
Admission: EM | Admit: 2021-03-25 | Discharge: 2021-03-25 | Disposition: A | Discharge status: Home / Self Care | Payer: Self-pay | Attending: Emergency Medicine | Admitting: Emergency Medicine

## 2021-03-25 ENCOUNTER — Other Ambulatory Visit: Payer: Self-pay

## 2021-03-25 DIAGNOSIS — R110 Nausea: Secondary | ICD-10-CM | POA: Insufficient documentation

## 2021-03-25 DIAGNOSIS — R109 Unspecified abdominal pain: Secondary | ICD-10-CM | POA: Insufficient documentation

## 2021-03-25 DIAGNOSIS — R197 Diarrhea, unspecified: Secondary | ICD-10-CM | POA: Insufficient documentation

## 2021-03-25 LAB — COMPREHENSIVE METABOLIC PANEL
ALT: 36 U/L (ref 0–44)
AST: 28 U/L (ref 15–41)
Albumin: 4.8 g/dL (ref 3.5–5.0)
Alkaline Phosphatase: 58 U/L (ref 38–126)
Anion gap: 12 (ref 5–15)
BUN: 13 mg/dL (ref 6–20)
CO2: 25 mmol/L (ref 22–32)
Calcium: 9.5 mg/dL (ref 8.9–10.3)
Chloride: 101 mmol/L (ref 98–111)
Creatinine, Ser: 0.89 mg/dL (ref 0.61–1.24)
GFR, Estimated: >60 mL/min (ref >60)
Glucose, Bld: 95 mg/dL (ref 70–99)
Potassium: 3.8 mmol/L (ref 3.5–5.1)
Sodium: 138 mmol/L (ref 135–145)
Total Bilirubin: 1 mg/dL (ref 0.3–1.2)
Total Protein: 8.6 g/dL — ABNORMAL HIGH (ref 6.5–8.1)

## 2021-03-25 LAB — LIPASE, BLOOD: Lipase: 35 U/L (ref 11–51)

## 2021-03-25 LAB — URINALYSIS, COMPLETE (UACMP) WITH MICROSCOPIC
Bacteria, UA: NONE SEEN
Bilirubin Urine: NEGATIVE
Glucose, UA: NEGATIVE mg/dL
Hgb urine dipstick: NEGATIVE
Ketones, ur: NEGATIVE mg/dL
Leukocytes,Ua: NEGATIVE
Nitrite: NEGATIVE
Protein, ur: NEGATIVE mg/dL
Specific Gravity, Urine: 1.025 (ref 1.005–1.030)
pH: 7 (ref 5.0–8.0)

## 2021-03-25 LAB — CHLAMYDIA/NGC RT PCR (ARMC ONLY)
Chlamydia Tr: NOT DETECTED
N gonorrhoeae: NOT DETECTED

## 2021-03-25 LAB — CBC
HCT: 48.9 % (ref 39.0–52.0)
Hemoglobin: 16.2 g/dL (ref 13.0–17.0)
MCH: 29.9 pg (ref 26.0–34.0)
MCHC: 33.1 g/dL (ref 30.0–36.0)
MCV: 90.2 fL (ref 80.0–100.0)
Platelets: 252 10*3/uL (ref 150–400)
RBC: 5.42 MIL/uL (ref 4.22–5.81)
RDW: 13 % (ref 11.5–15.5)
WBC: 8.5 10*3/uL (ref 4.0–10.5)
nRBC: 0 % (ref 0.0–0.2)

## 2021-03-25 MED ORDER — DICYCLOMINE HCL 10 MG PO CAPS: 10.0000 mg | ORAL_CAPSULE | Freq: Three times a day (TID) | ORAL | 0 refills | Status: AC | PRN

## 2021-03-25 NOTE — ED Provider Notes (Signed)
[facility] Emergency Department Provider Note  Time seen: 4:26 PM  I have reviewed the triage vital signs and the nursing notes.   HISTORY  Chief Complaint Abdominal Pain   HPI Shane Wells is a 30 y.o. male with no significant past medical history presents emergency department with multiple complaints.  According to the patient for several days he was constipated until yesterday he began experiencing very watery diarrhea per patient.  He describes some nausea as well as right flank pain.  Denies any known fever.  Patient also states he has been several years since he had an STD checkup and is requesting an STD check denies any penile discharge or dysuria.   Past Medical History:  Diagnosis Date  . Gallstone     There are no problems to display for this patient.   History reviewed. No pertinent surgical history.  Prior to Admission medications   Medication Sig Start Date End Date Taking? Authorizing Provider  cyclobenzaprine (FLEXERIL) 5 MG tablet Take 1 tablet (5 mg total) by mouth 3 (three) times daily as needed for muscle spasms. 06/29/18   Menshew, Charlesetta Ivory, PA-C  guaiFENesin-codeine 100-10 MG/5ML syrup Take 5 mLs by mouth every 4 (four) hours as needed. 11/15/16   Tommi Rumps, PA-C    No Known Allergies  No family history on file.  Social History Social History   Tobacco Use  . Smoking status: Never Smoker  . Smokeless tobacco: Never Used  Substance Use Topics  . Alcohol use: No  . Drug use: Yes    Types: Marijuana    Comment: yesterday    Review of Systems Constitutional: Negative for fever Cardiovascular: Negative for chest pain. Respiratory: Negative for shortness of breath. Gastrointestinal: Mild to moderate right flank pain.  Positive for nausea and diarrhea.  Negative for vomiting Genitourinary: Negative for urinary compaints Musculoskeletal: Negative for musculoskeletal complaints Neurological: Negative for  headache All other ROS negative  ____________________________________________   PHYSICAL EXAM:  VITAL SIGNS: ED Triage Vitals  Enc Vitals Group     BP 03/25/21 1544 (!) 175/107     Pulse Rate 03/25/21 1544 95     Resp 03/25/21 1544 18     Temp 03/25/21 1544 98 F (36.7 C)     Temp src --      SpO2 03/25/21 1544 100 %     Weight --      Height --      Head Circumference --      Peak Flow --      Pain Score 03/25/21 1542 5     Pain Loc --      Pain Edu? --      Excl. in GC? --    Constitutional: Alert and oriented. Well appearing and in no distress. Eyes: Normal exam ENT      Head: Normocephalic and atraumatic.      Mouth/Throat: Mucous membranes are moist. Cardiovascular: Normal rate, regular rhythm. No murmur Respiratory: Normal respiratory effort without tachypnea nor retractions. Breath sounds are clear  Gastrointestinal: Soft and nontender. No distention.  mild Right CVA ttp. obese. Musculoskeletal: Nontender with normal range of motion in all extremities.  Neurologic:  Normal speech and language. No gross focal neurologic deficits Skin:  Skin is warm, dry and intact.  Psychiatric: Mood and affect are normal.      RADIOLOGY  CT scan is negative for acute abnormalities  ____________________________________________   INITIAL IMPRESSION / ASSESSMENT AND PLAN / ED COURSE  Pertinent labs & imaging results that were available during my care of the patient were reviewed by me and considered in my medical decision making (see chart for details).   Patient presents emergency department for right flank pain nausea and diarrhea.  No fever.  Abdomen is largely benign besides right CVA tenderness which is mild.  Labs have begun to result largely within normal limits including urinalysis.  I have added on a GC/chlamydia into the urine as a precaution although the patient denies any symptoms we will hold off on treatment for now.  Given the patient's right flank pain we will  obtain a CT renal scan to evaluate for possible ureterolithiasis.  Patient agreeable.  Differential would also include enteritis or gastroenteritis.  CT scan is negative for acute abnormality.  Lab work is reassuring.  Given the patient's reassuring work-up we will discharge home with Bentyl and have the patient follow-up with his PCP.  Patient agreeable to plan of care.  Shane Wells was evaluated in Emergency Department on 03/25/2021 for the symptoms described in the history of present illness. He was evaluated in the context of the global COVID-19 pandemic, which necessitated consideration that the patient might be at risk for infection with the SARS-CoV-2 virus that causes COVID-19. Institutional protocols and algorithms that pertain to the evaluation of patients at risk for COVID-19 are in a state of rapid change based on information released by regulatory bodies including the CDC and federal and state organizations. These policies and algorithms were followed during the patient's care in the ED.  ____________________________________________   FINAL CLINICAL IMPRESSION(S) / ED DIAGNOSES  Diarrhea Abdominal pain   Minna Antis, MD 03/25/21 1705

## 2021-03-25 NOTE — ED Triage Notes (Signed)
Pt comes with c/o diarrhea and abdominal pain for last few days. Pt states lower right sided pain. Pt denies any urinary symptoms.  Pt denies any N/V

## 2021-03-25 NOTE — ED Triage Notes (Signed)
First Nurse Note:  Arrives c/o abdominal pain and diarrhea x 3 days.  AAOx3.  Skin warm and dry. NAD

## 2021-04-09 ENCOUNTER — Other Ambulatory Visit: Payer: Self-pay

## 2021-04-09 ENCOUNTER — Emergency Department
Admission: EM | Admit: 2021-04-09 | Discharge: 2021-04-09 | Disposition: A | Discharge status: Home / Self Care | Payer: Self-pay | Attending: Emergency Medicine | Admitting: Emergency Medicine

## 2021-04-09 ENCOUNTER — Encounter: Payer: Self-pay | Admitting: Emergency Medicine

## 2021-04-09 DIAGNOSIS — Z5321 Procedure and treatment not carried out due to patient leaving prior to being seen by health care provider: Secondary | ICD-10-CM | POA: Insufficient documentation

## 2021-04-09 DIAGNOSIS — R221 Localized swelling, mass and lump, neck: Secondary | ICD-10-CM | POA: Insufficient documentation

## 2021-04-09 NOTE — ED Triage Notes (Signed)
Patient ambulatory to triage with steady gait, without difficulty or distress noted; pt reports swelling to rt side neck since yesterday, some tenderness with palpation; denies any difficulty or pain with swallowing

## 2022-01-20 DIAGNOSIS — K8020 Calculus of gallbladder without cholecystitis without obstruction: Secondary | ICD-10-CM

## 2022-01-20 NOTE — ED Provider Notes (Signed)
[name]  Emergency Department    ED Course     ED Course as of 01/21/22 0349   Wed Jan 20, 2022   [id number] 31 year old male with history of reported gallstones presents with 8 hours of right upper quadrant pain radiating to back.  Will obtain blood work and gallbladder ultrasound and give fluids/pain control/antiemetics [ER]   Thu Jan 21, 2022   1610 Able to draw labs, but unable to obtain IV. Pt declined attempt at US guided IV placement. Will change to IM/oral meds and reassess as neeed [ER]   0304 Feels improved on reassessment.  Blood work within normal limits.  Ultrasound per my read shows large gallstone without evidence of cholecystitis.  Pending radiology report. [ER]      ED Course User Index  [ER] Iverson Alamin, MD       Complexity of Problems Addressed:  1 or more stable acute illness    Data Reviewed and Analyzed:  Category 1:   I ordered each unique test.  I reviewed the results of each unique test.      Category 2:   I independently ordered and reviewed the Ultrasound.     I independently ordered and reviewed the blood work    Category 3: Discussion of management or test interpretation.  See ED Course above    HPI   Chad Barker is a 31 y.o. male with a history of none who presents to the ED with complaint of abdominal pain.  Patient reports a 8-hour history of pain to his right upper quadrant.  Symptoms described as sharp and stabbing radiate to back.  He was eating pizza for dinner this evening when symptoms started.  States he has a history of gallstones does not follow-up with a Careers adviser.  Denies fevers but does report chills.  Nausea without vomiting.  No medications prior to arrival    ROS   Review of Systems   Constitutional:  Negative for fever.   Gastrointestinal:  Positive for abdominal pain and nausea.   All other systems reviewed and are negative.    History   No past medical history on file.  No past surgical history on file.  No family history on file.  No  Known Allergies    Physical Exam     Vitals:    01/21/22 0000 01/21/22 0004   BP: 136/81 126/73   Pulse: 72    Resp: 16    Temp: 97.5 ??F (36.4 ??C)    SpO2: 99% 100%   Height: 6\' 4"  (1.93 m)      Nursing note and vitals reviewed.    Constitutional: Morbidly obese, NAD  HEENT: Atraumatic, conjugate gaze, EOM intact  Neck: Supple  Cardiovascular: No cyanosis, diaphoresis, or JVD appreciated.  Respiratory: Effort normal. No respiratory distress.   Gastrointestinal: Bowel sounds present. Non-distended. No guarding or rebound.  Tenderness to right upper quadrant.  Negative Murphy's  MSK: No deformities appreciated. No peripheral edema.  Skin: Skin is warm and dry. No rash appreciated.  Neuro: Alert and oriented, moves all four extremities.  Psych: Pleasant and cooperative.    Procedures   Procedures    MDM     Labs Reviewed   CBC WITH AUTO DIFFERENTIAL - Abnormal; Notable for the following components:       Result Value    MPV 12.4 (*)     Seg Neutrophils 86 (*)     Lymphocytes 9 (*)  Eosinophils % 0 (*)     Segs Absolute 9.4 (*)     All other components within normal limits   COMPREHENSIVE METABOLIC PANEL - Abnormal; Notable for the following components:    Glucose 132 (*)     Total Protein 8.8 (*)     All other components within normal limits   LIPASE     Medications   ketorolac (TORADOL) injection 15 mg (15 mg IntraMUSCular Given 01/21/22 0100)   ondansetron (ZOFRAN-ODT) disintegrating tablet 4 mg (4 mg Oral Given 01/21/22 0100)     US ABDOMEN LIMITED Specify organ? GALLBLADDER   Final Result      Limited exam due to body habitus.      Nonmobile gallbladder neck stone with distended gallbladder. No definite wall    thickening.      Hepatic steatosis.         Slot 61       [name], M.D.    01/21/2022 3:20:00 AM        Voice dictation software was used during the making of this note.  This software is not perfect and grammatical and other typographical errors may be present.  This note has not been completely  proofread for errors.     Iverson Alamin, MD  01/21/22 [phone]

## 2022-01-20 NOTE — ED Triage Notes (Signed)
Pt. A/ox4 and ambulatory to room. Pt. C/o right side flank pain that radiates to stomach starting at 1600 yesterday. Pt. W/hx of gallstones.

## 2022-01-21 ENCOUNTER — Inpatient Hospital Stay
Admit: 2022-01-21 | Discharge: 2022-01-21 | Disposition: A | Discharge status: Home / Self Care | Attending: Emergency Medicine

## 2022-01-21 ENCOUNTER — Emergency Department: Admit: 2022-01-21

## 2022-01-21 LAB — COMPREHENSIVE METABOLIC PANEL
ALT: 37 U/L (ref 12–65)
AST: 29 U/L (ref 15–37)
Albumin/Globulin Ratio: 1 (ref 0.4–1.6)
Albumin: 4.3 g/dL (ref 3.5–5.0)
Alk Phosphatase: 75 U/L (ref 50–136)
Anion Gap: 8 mmol/L (ref 2–11)
BUN: 11 MG/DL (ref 6–23)
CO2: 28 mmol/L (ref 21–32)
Calcium: 9.6 MG/DL (ref 8.3–10.4)
Chloride: 102 mmol/L (ref 101–110)
Creatinine: 0.98 MG/DL (ref 0.8–1.5)
Est, Glom Filt Rate: >60 mL/min/{1.73_m2} (ref >60)
Globulin: 4.5 g/dL (ref 2.8–4.5)
Glucose: 132 mg/dL — ABNORMAL HIGH (ref 65–100)
Potassium: 4.3 mmol/L (ref 3.5–5.1)
Sodium: 138 mmol/L (ref 133–143)
Total Bilirubin: 0.5 MG/DL (ref 0.2–1.1)
Total Protein: 8.8 g/dL — ABNORMAL HIGH (ref 6.3–8.2)

## 2022-01-21 LAB — CBC WITH AUTO DIFFERENTIAL
Absolute Immature Granulocyte: 0 10*3/uL (ref 0.0–0.5)
Basophils %: 1 % (ref 0.0–2.0)
Basophils Absolute: 0.1 10*3/uL (ref 0.0–0.2)
Eosinophils %: 0 % — ABNORMAL LOW (ref 0.5–7.8)
Eosinophils Absolute: 0 10*3/uL (ref 0.0–0.8)
Hematocrit: 48 % (ref 41.1–50.3)
Hemoglobin: 16 g/dL (ref 13.6–17.2)
Immature Granulocytes: 0 % (ref 0.0–5.0)
Lymphocytes %: 9 % — ABNORMAL LOW (ref 13–44)
Lymphocytes Absolute: 1 10*3/uL (ref 0.5–4.6)
MCH: 29.9 PG (ref 26.1–32.9)
MCHC: 33.3 g/dL (ref 31.4–35.0)
MCV: 89.7 FL (ref 82.0–102.0)
MPV: 12.4 FL — ABNORMAL HIGH (ref 9.4–12.3)
Monocytes %: 4 % (ref 4.0–12.0)
Monocytes Absolute: 0.4 10*3/uL (ref 0.1–1.3)
Neutrophils %: 86 % — ABNORMAL HIGH (ref 43–78)
Neutrophils Absolute: 9.4 10*3/uL — ABNORMAL HIGH (ref 1.7–8.2)
Platelet Comment: ADEQUATE
Platelets: 199 10*3/uL (ref 150–450)
RBC: 5.35 M/uL (ref 4.23–5.6)
RDW: 13 % (ref 11.9–14.6)
WBC: 10.9 10*3/uL (ref 4.3–11.1)
nRBC: 0 10*3/uL (ref 0.0–0.2)

## 2022-01-21 LAB — LIPASE: Lipase: 109 U/L (ref 73–393)

## 2022-01-21 MED ORDER — NAPROXEN 500 MG PO TABS
500 MG | ORAL_TABLET | Freq: Two times a day (BID) | ORAL | 0 refills | Status: DC
Start: 2022-01-21 — End: 2022-01-21

## 2022-01-21 MED ORDER — ONDANSETRON HCL 4 MG PO TABS
4 MG | ORAL_TABLET | Freq: Three times a day (TID) | ORAL | 0 refills | Status: DC | PRN
Start: 2022-01-21 — End: 2022-01-21

## 2022-01-21 MED ORDER — KETOROLAC TROMETHAMINE 15 MG/ML IJ SOLN
15 MG/ML | Freq: Once | INTRAMUSCULAR | Status: AC
Start: 2022-01-21 — End: 2022-01-21
  Administered 2022-01-21: 06:00:00 15 mg via INTRAMUSCULAR

## 2022-01-21 MED ORDER — SODIUM CHLORIDE 0.9 % IV BOLUS
0.9 % | Freq: Once | INTRAVENOUS | Status: DC
Start: 2022-01-21 — End: 2022-01-21

## 2022-01-21 MED ORDER — ONDANSETRON HCL 4 MG/2ML IJ SOLN
4 MG/2ML | Freq: Once | INTRAMUSCULAR | Status: DC
Start: 2022-01-21 — End: 2022-01-21

## 2022-01-21 MED ORDER — ONDANSETRON HCL 4 MG PO TABS
4 MG | ORAL_TABLET | Freq: Three times a day (TID) | ORAL | 0 refills | Status: AC | PRN
Start: 2022-01-21 — End: ?

## 2022-01-21 MED ORDER — NAPROXEN 500 MG PO TABS
500 MG | ORAL_TABLET | Freq: Two times a day (BID) | ORAL | 0 refills | Status: AC
Start: 2022-01-21 — End: ?

## 2022-01-21 MED ORDER — ONDANSETRON 4 MG PO TBDP
4 MG | Freq: Once | ORAL | Status: AC
Start: 2022-01-21 — End: 2022-01-21
  Administered 2022-01-21: 06:00:00 4 mg via ORAL

## 2022-01-21 MED ORDER — KETOROLAC TROMETHAMINE 15 MG/ML IJ SOLN
15 MG/ML | Freq: Once | INTRAMUSCULAR | Status: DC
Start: 2022-01-21 — End: 2022-01-21

## 2022-01-21 MED FILL — KETOROLAC TROMETHAMINE 15 MG/ML IJ SOLN: 15 MG/ML | INTRAMUSCULAR | Qty: 1

## 2022-01-21 MED FILL — ONDANSETRON 4 MG PO TBDP: 4 MG | ORAL | Qty: 1

## 2022-01-21 NOTE — Discharge Instructions (Addendum)
Blood work today was reassuring.  Ultrasound did show a large stone in your gallbladder.  Medication as directed and if she have any further pain or vomiting.  Return to the ER if you have any persistent pain or develop fevers.  Please follow-up with the general surgery clinic in the next few weeks for discussion of consideration of having your gallbladder removed.  Give them a call today to schedule your appointment.  Try to avoid greasy, fatty foods as this can make your symptoms worse.    [facility]  [address]  [address]  [place], [place]  [address]  [phone]

## 2022-01-21 NOTE — ED Notes (Signed)
I have reviewed discharge instructions with the patient.  The patient verbalized understanding.    Patient left ED via Discharge Method: ambulatory to Home with s/o.    Opportunity for questions and clarification provided.       Patient given 0 scripts. Px sent to pharmacy        To continue your aftercare when you leave the hospital, you may receive an automated call from our care team to check in on how you are doing.  This is a free service and part of our promise to provide the best care and service to meet your aftercare needs.??? If you have questions, or wish to unsubscribe from this service please call [phone].  Thank you for Choosing our [facility] Emergency Department.        Jeni Salles, RN  01/21/22 [phone]

## 2022-08-05 ENCOUNTER — Encounter: Payer: Self-pay | Admitting: Emergency Medicine

## 2023-01-09 IMAGING — CT CT RENAL STONE PROTOCOL
2 of 4 series · 17 of 46 positions shown, 19 images · non-contrast
Comparison: CT 11/26/2017

CLINICAL DATA: Right-sided flank pain

EXAM:
CT ABDOMEN AND PELVIS WITHOUT CONTRAST
TECHNIQUE: Multidetector CT imaging of the abdomen and pelvis was performed
following the standard protocol without IV contrast.

[Series 2: stone full standard · axial · 0.98mm/px · z∈[-1198,-648]mm · 14 of 121 slices shown, 16 images]
[im 6/121  soft-tissue]
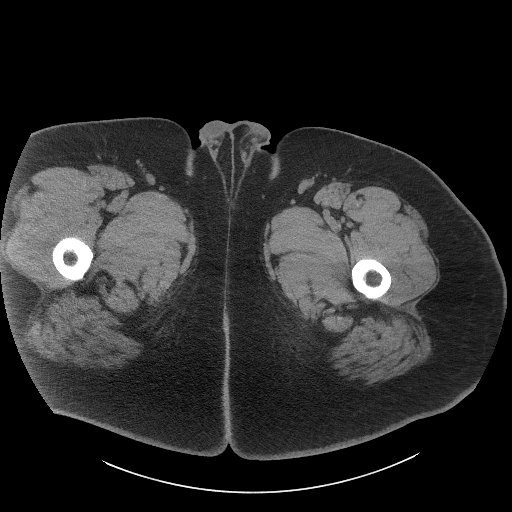
[im 6/121  bone]
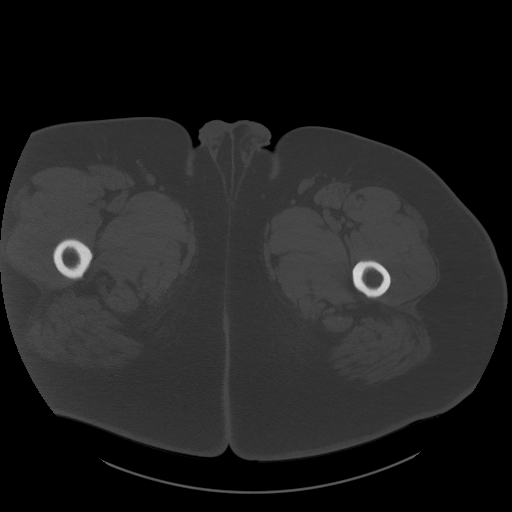
[im 16/121  soft-tissue]
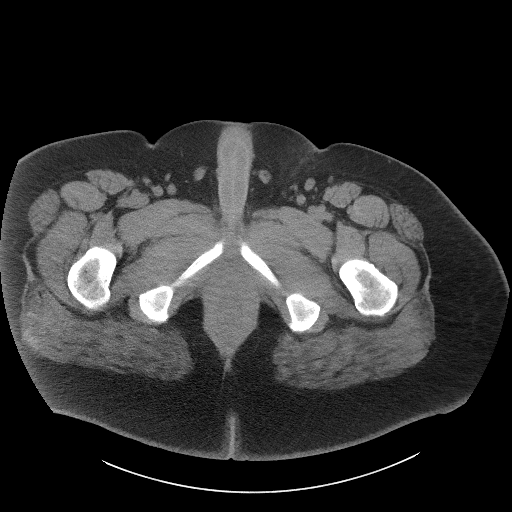
[im 26/121  soft-tissue]
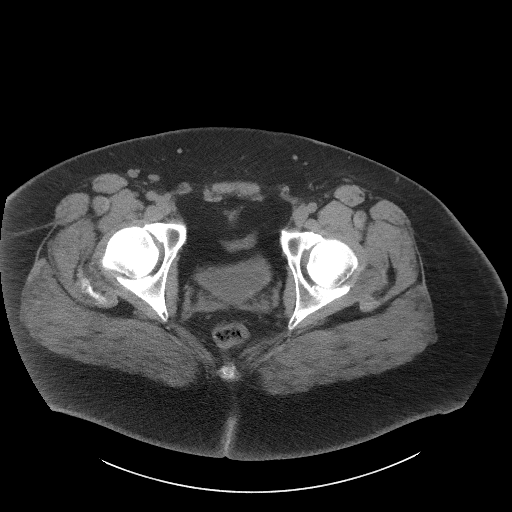
[im 31/121  soft-tissue]
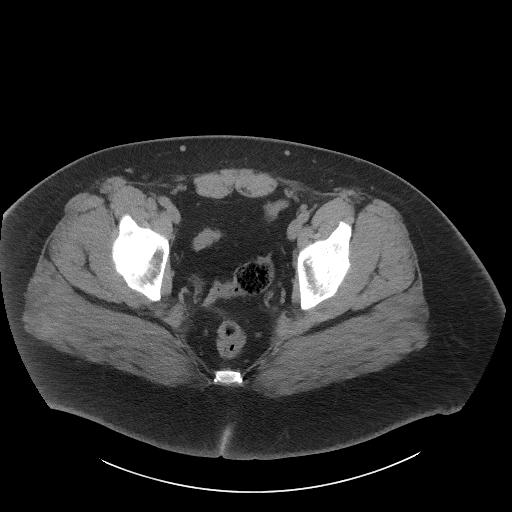
[im 41/121  soft-tissue]
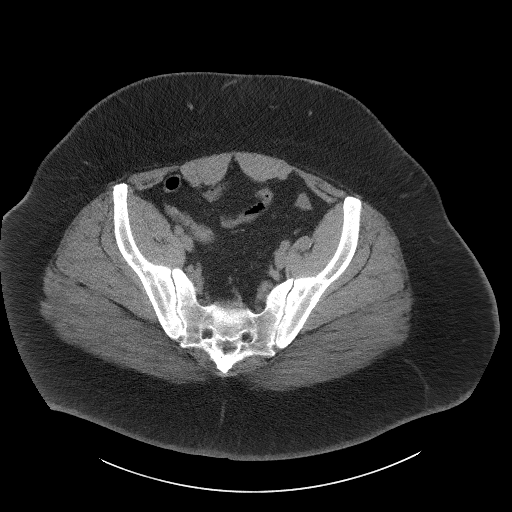
[im 51/121  soft-tissue]
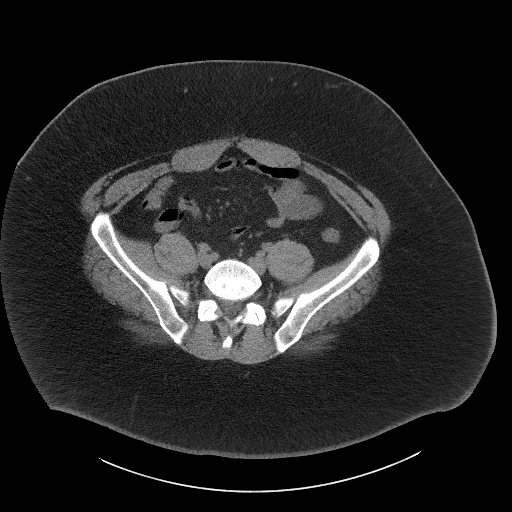
[im 56/121  soft-tissue]
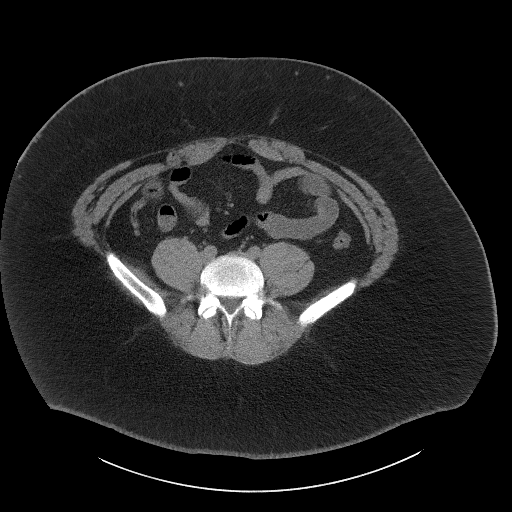
[im 66/121  soft-tissue]
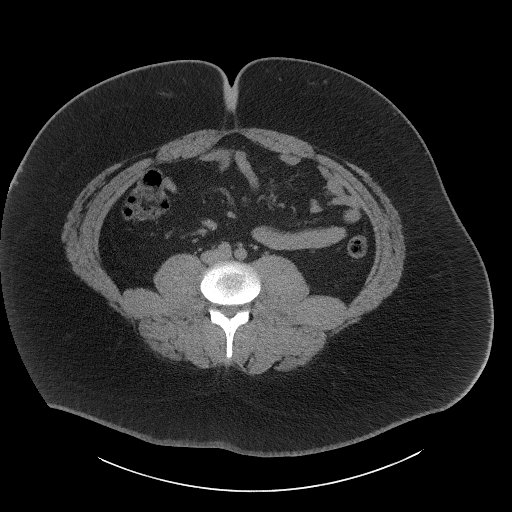
[im 71/121  soft-tissue]
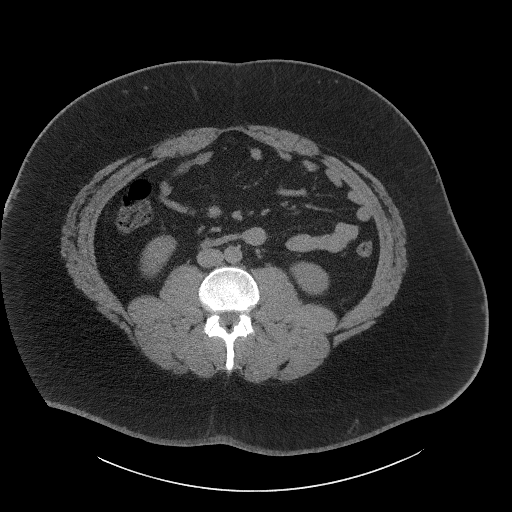
[im 71/121  bone]
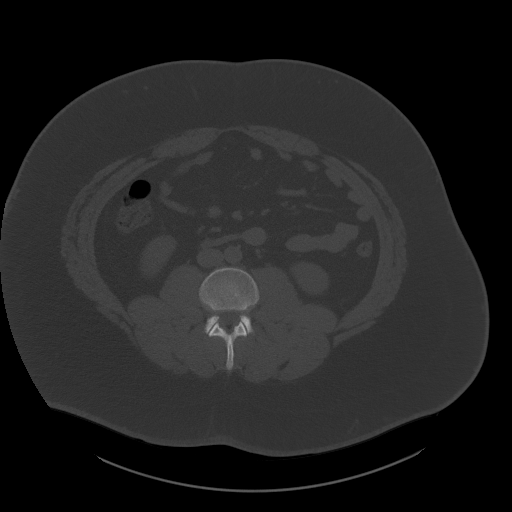
[im 81/121  soft-tissue]
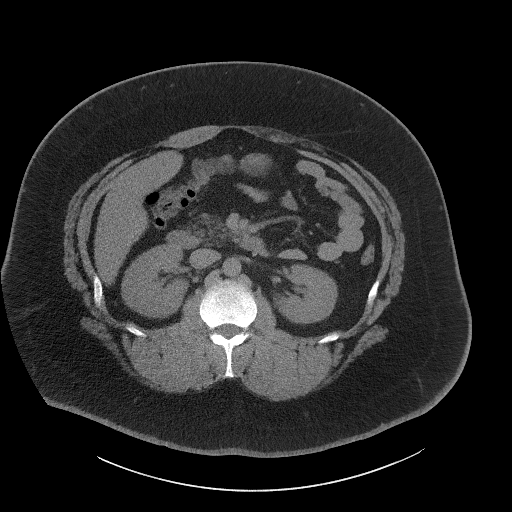
[im 91/121  soft-tissue]
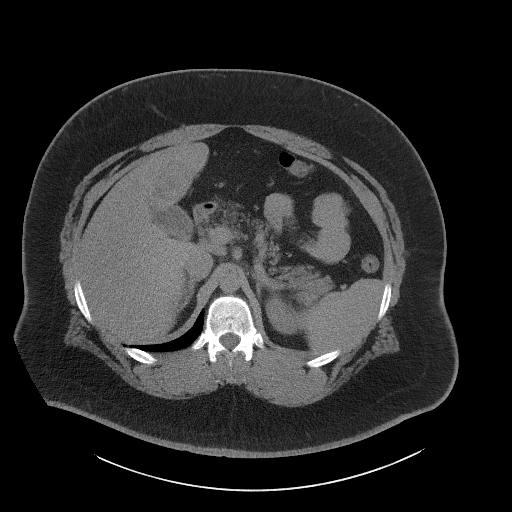
[im 96/121  soft-tissue]
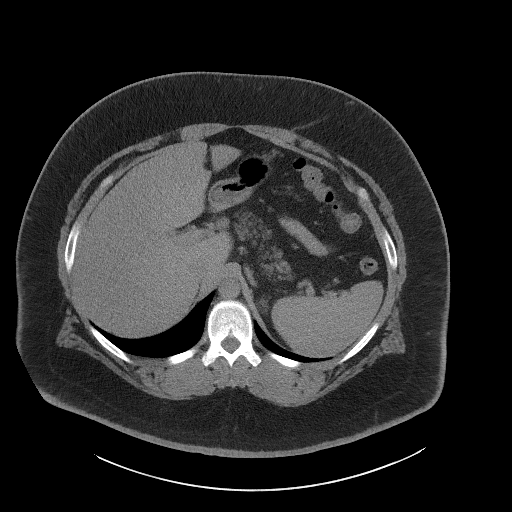
[im 106/121  soft-tissue]
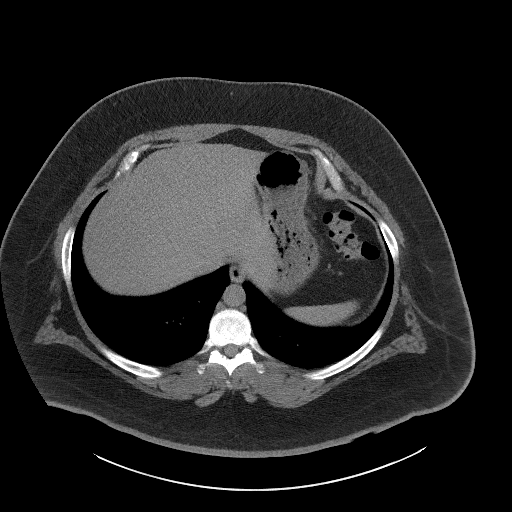
[im 116/121  soft-tissue]
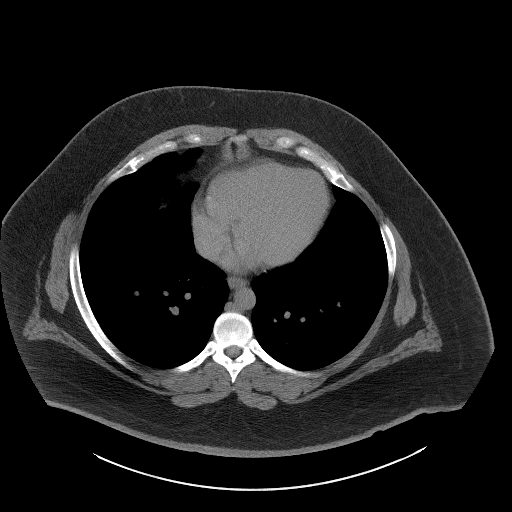

[Series 5: coronal · coronal · 0.99mm/px · 3 of 189 slices shown]
[im 63/189  soft-tissue]
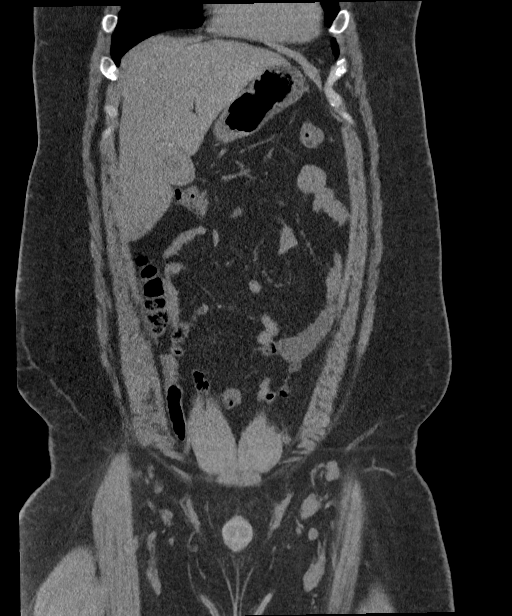
[im 84/189  soft-tissue]
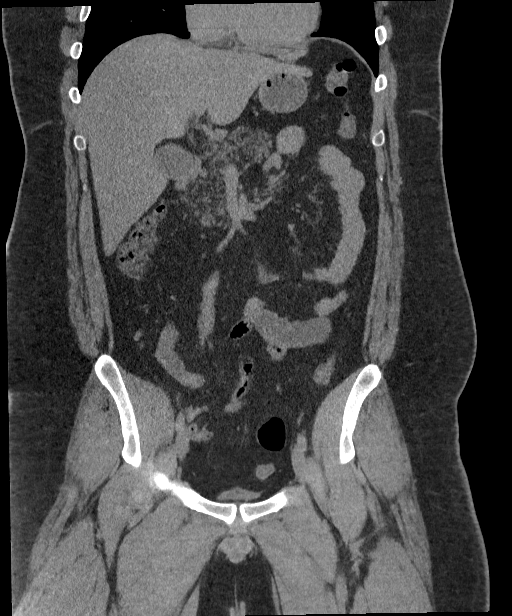
[im 105/189  soft-tissue]
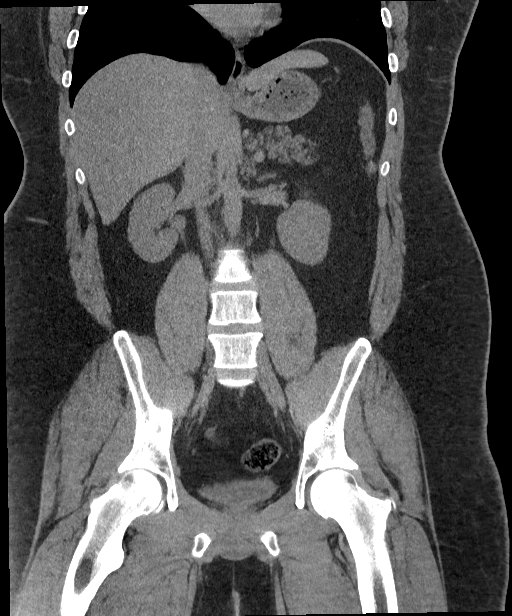

[17 of 46 positions shown; findings below may reference images not displayed]

FINDINGS: Lower chest: No acute abnormality.

Hepatobiliary: Large lamellated gallstone in the fundus. No
inflammatory changes or ductal dilatation. No focal hepatic
abnormality.

Pancreas: Unremarkable. No pancreatic ductal dilatation or
surrounding inflammatory changes.

Spleen: Normal in size without focal abnormality.

Adrenals/Urinary Tract: Adrenal glands are unremarkable. Kidneys are
normal, without renal calculi, focal lesion, or hydronephrosis.
Bladder is unremarkable.

Stomach/Bowel: Stomach is within normal limits. Appendix appears
normal. No evidence of bowel wall thickening, distention, or
inflammatory changes.

Vascular/Lymphatic: No significant vascular findings are present. No
enlarged abdominal or pelvic lymph nodes.

Reproductive: Prostate is unremarkable.

Other: No abdominal wall hernia or abnormality. No abdominopelvic
ascites.

Musculoskeletal: No acute or significant osseous findings.
IMPRESSION: 1. No CT evidence for acute intra-abdominal or pelvic abnormality.
2. Large gallstone without inflammatory changes in the right upper
quadrant
